# Patient Record
Sex: Female | Born: 1945
Health system: Southern US, Community
[De-identification: ages and names within clinical notes are randomized; demographics above are authoritative.]

## PROBLEM LIST (undated history)

## (undated) ENCOUNTER — Emergency Department (HOSPITAL_COMMUNITY): Admission: EM | Payer: Medicare Other | Source: Home / Self Care

## (undated) DIAGNOSIS — J449 Chronic obstructive pulmonary disease, unspecified: Secondary | ICD-10-CM

## (undated) DIAGNOSIS — C801 Malignant (primary) neoplasm, unspecified: Secondary | ICD-10-CM

---

## 2000-02-26 ENCOUNTER — Other Ambulatory Visit: Admission: RE | Admit: 2000-02-26 | Discharge: 2000-02-26 | Payer: Self-pay | Admitting: Gynecology

## 2004-02-16 ENCOUNTER — Encounter (INDEPENDENT_AMBULATORY_CARE_PROVIDER_SITE_OTHER): Payer: Self-pay | Admitting: Specialist

## 2004-02-16 ENCOUNTER — Ambulatory Visit (HOSPITAL_COMMUNITY): Admission: RE | Admit: 2004-02-16 | Discharge: 2004-02-17 | Payer: Self-pay | Admitting: Otolaryngology

## 2007-06-17 ENCOUNTER — Other Ambulatory Visit: Admission: RE | Admit: 2007-06-17 | Discharge: 2007-06-17 | Payer: Self-pay | Admitting: Family Medicine

## 2007-06-26 ENCOUNTER — Encounter: Admission: RE | Admit: 2007-06-26 | Discharge: 2007-06-26 | Payer: Self-pay | Admitting: Family Medicine

## 2007-07-07 ENCOUNTER — Encounter: Admission: RE | Admit: 2007-07-07 | Discharge: 2007-07-07 | Payer: Self-pay | Admitting: Family Medicine

## 2008-10-03 ENCOUNTER — Inpatient Hospital Stay (HOSPITAL_COMMUNITY): Admission: EM | Admit: 2008-10-03 | Discharge: 2008-10-07 | Payer: Self-pay | Admitting: Emergency Medicine

## 2009-05-05 ENCOUNTER — Emergency Department (HOSPITAL_COMMUNITY): Admission: EM | Admit: 2009-05-05 | Discharge: 2009-05-05 | Payer: Self-pay | Admitting: Emergency Medicine

## 2009-05-08 ENCOUNTER — Emergency Department (HOSPITAL_COMMUNITY): Admission: EM | Admit: 2009-05-08 | Discharge: 2009-05-08 | Payer: Self-pay | Admitting: Emergency Medicine

## 2011-01-07 LAB — C-REACTIVE PROTEIN: CRP: 11.1 mg/dL — ABNORMAL HIGH (ref <0.6)

## 2011-01-07 LAB — COMPREHENSIVE METABOLIC PANEL
ALT: 10 U/L (ref 0–35)
ALT: 13 U/L (ref 0–35)
AST: 17 U/L (ref 0–37)
AST: 21 U/L (ref 0–37)
Albumin: 2.3 g/dL — ABNORMAL LOW (ref 3.5–5.2)
Albumin: 2.5 g/dL — ABNORMAL LOW (ref 3.5–5.2)
Albumin: 2.5 g/dL — ABNORMAL LOW (ref 3.5–5.2)
Alkaline Phosphatase: 56 U/L (ref 39–117)
BUN: 2 mg/dL — ABNORMAL LOW (ref 6–23)
BUN: 4 mg/dL — ABNORMAL LOW (ref 6–23)
CO2: 27 mEq/L (ref 19–32)
CO2: 28 mEq/L (ref 19–32)
Chloride: 102 mEq/L (ref 96–112)
Chloride: 107 mEq/L (ref 96–112)
Chloride: 111 mEq/L (ref 96–112)
Creatinine, Ser: 0.65 mg/dL (ref 0.4–1.2)
GFR calc Af Amer: >60 mL/min (ref >60)
GFR calc non Af Amer: >60 mL/min (ref >60)
GFR calc non Af Amer: >60 mL/min (ref >60)
Potassium: 3.3 mEq/L — ABNORMAL LOW (ref 3.5–5.1)
Potassium: 3.4 mEq/L — ABNORMAL LOW (ref 3.5–5.1)
Potassium: 3.4 mEq/L — ABNORMAL LOW (ref 3.5–5.1)
Sodium: 139 mEq/L (ref 135–145)
Sodium: 141 mEq/L (ref 135–145)
Total Bilirubin: 0.4 mg/dL (ref 0.3–1.2)
Total Bilirubin: 0.6 mg/dL (ref 0.3–1.2)
Total Protein: 5.5 g/dL — ABNORMAL LOW (ref 6.0–8.3)
Total Protein: 5.9 g/dL — ABNORMAL LOW (ref 6.0–8.3)

## 2011-01-07 LAB — CBC
HCT: 34.5 % — ABNORMAL LOW (ref 36.0–46.0)
HCT: 46.1 % — ABNORMAL HIGH (ref 36.0–46.0)
Hemoglobin: 11.4 g/dL — ABNORMAL LOW (ref 12.0–15.0)
Hemoglobin: 15.2 g/dL — ABNORMAL HIGH (ref 12.0–15.0)
MCHC: 32.9 g/dL (ref 30.0–36.0)
MCV: 93 fL (ref 78.0–100.0)
MCV: 93.2 fL (ref 78.0–100.0)
Platelets: 174 10*3/uL (ref 150–400)
Platelets: 205 10*3/uL (ref 150–400)
Platelets: 214 10*3/uL (ref 150–400)
Platelets: 243 K/uL (ref 150–400)
RBC: 3.73 MIL/uL — ABNORMAL LOW (ref 3.87–5.11)
RBC: 3.79 MIL/uL — ABNORMAL LOW (ref 3.87–5.11)
RBC: 4.95 MIL/uL (ref 3.87–5.11)
RDW: 14.4 % (ref 11.5–15.5)
RDW: 14.4 % (ref 11.5–15.5)
WBC: 12.9 K/uL — ABNORMAL HIGH (ref 4.0–10.5)
WBC: 4.9 10*3/uL (ref 4.0–10.5)
WBC: 5.3 10*3/uL (ref 4.0–10.5)

## 2011-01-07 LAB — CULTURE, BLOOD (ROUTINE X 2): Culture: NO GROWTH

## 2011-01-07 LAB — DIFFERENTIAL
Basophils Absolute: 0 10*3/uL (ref 0.0–0.1)
Basophils Relative: 0 % (ref 0–1)
Eosinophils Absolute: 0 K/uL (ref 0.0–0.7)
Eosinophils Relative: 0 % (ref 0–5)
Lymphocytes Relative: 7 % — ABNORMAL LOW (ref 12–46)
Lymphs Abs: 0.9 K/uL (ref 0.7–4.0)
Monocytes Absolute: 0.7 10*3/uL (ref 0.1–1.0)
Monocytes Relative: 5 % (ref 3–12)
Neutro Abs: 11.2 10*3/uL — ABNORMAL HIGH (ref 1.7–7.7)
Neutrophils Relative %: 87 % — ABNORMAL HIGH (ref 43–77)

## 2011-01-07 LAB — BASIC METABOLIC PANEL
BUN: 14 mg/dL (ref 6–23)
Calcium: 9.2 mg/dL (ref 8.4–10.5)
Creatinine, Ser: 0.87 mg/dL (ref 0.4–1.2)
GFR calc Af Amer: >60 mL/min (ref >60)

## 2011-01-07 LAB — BASIC METABOLIC PANEL WITH GFR
CO2: 27 meq/L (ref 19–32)
Chloride: 97 meq/L (ref 96–112)
GFR calc non Af Amer: >60 mL/min (ref >60)
Glucose, Bld: 98 mg/dL (ref 70–99)
Potassium: 3.8 meq/L (ref 3.5–5.1)
Sodium: 134 meq/L — ABNORMAL LOW (ref 135–145)

## 2011-01-27 ENCOUNTER — Emergency Department (HOSPITAL_COMMUNITY)
Admission: EM | Admit: 2011-01-27 | Discharge: 2011-01-27 | Disposition: A | Discharge status: Home / Self Care | Payer: Self-pay | Attending: Emergency Medicine | Admitting: Emergency Medicine

## 2011-01-27 DIAGNOSIS — H109 Unspecified conjunctivitis: Secondary | ICD-10-CM | POA: Insufficient documentation

## 2011-01-27 DIAGNOSIS — H9209 Otalgia, unspecified ear: Secondary | ICD-10-CM | POA: Insufficient documentation

## 2011-01-27 DIAGNOSIS — J449 Chronic obstructive pulmonary disease, unspecified: Secondary | ICD-10-CM | POA: Insufficient documentation

## 2011-01-27 DIAGNOSIS — I2510 Atherosclerotic heart disease of native coronary artery without angina pectoris: Secondary | ICD-10-CM | POA: Insufficient documentation

## 2011-01-27 DIAGNOSIS — H9210 Otorrhea, unspecified ear: Secondary | ICD-10-CM | POA: Insufficient documentation

## 2011-01-27 DIAGNOSIS — J4489 Other specified chronic obstructive pulmonary disease: Secondary | ICD-10-CM | POA: Insufficient documentation

## 2011-01-27 DIAGNOSIS — H60399 Other infective otitis externa, unspecified ear: Secondary | ICD-10-CM | POA: Insufficient documentation

## 2011-02-05 NOTE — Discharge Summary (Signed)
Victoria Kennedy, Victoria Kennedy               ACCOUNT NO.:  [id number]   MEDICAL RECORD NO.:  [id number]           PATIENT TYPE:   LOCATION:                                 FACILITY:   PHYSICIAN:  Renee Ramus, MD       DATE OF BIRTH:  1946/04/11   DATE OF ADMISSION:  DATE OF DISCHARGE:                               DISCHARGE SUMMARY   PRIMARY DISCHARGE DIAGNOSIS:  Facial cellulitis.   SECONDARY DIAGNOSES:  1. Otitis media.  2. Tobacco history.   HOSPITAL COURSE:  1. Facial cellulitis:  The patient is a 65 year old female admitted      secondary to pain and swelling in the right side of her face and      ear.  The patient had symptoms approximately 1 week prior to      admission.  She noticed a lot of draining from her right ear.  She      was seen in the emergency department.  There, she was diagnosed      with cellulitis and erysipelas.  She was placed on broad-spectrum      antibiotics and she responded well.  She is being tailored to      ciprofloxacin and will continue taking ciprofloxacin x2 weeks      postdischarge.  This will co-treat her otitis media and so she      requires no other antibiotics.  The patient will also use Benadryl      p.r.n. for swelling and for itching.   LABORATORY DATA OF NOTE:  1. Initial leukocytosis with white count of 12.9, decreasing to 4.9.  2. Mild anemia with a hemoglobin of 11.4 and hematocrit of 34.5.  3. Mild hypoalbuminemia with an albumin of 2.5.  4. CRP elevated at 11.1.   STUDIES:  CT of the face showing swelling of the pinna and the right  face consistent with cellulitis, but no sign of drainable soft tissue  abscess.   DISCHARGE MEDICATIONS:  Ciprofloxacin 500 mg 1 p.o. b.i.d. x14 days.   There are no labs or studies pending at the time of discharge.  The  patient is in stable condition and anxious for discharge.   TIME SPENT:  35 minutes.      Renee Ramus, MD  Electronically Signed     JF/MEDQ  D:  10/07/2008  T:   10/08/2008  Job:  [id number]

## 2011-02-05 NOTE — Consult Note (Signed)
[name], [name]               ACCOUNT NO.:  [id number]   MEDICAL RECORD NO.:  [id number]          PATIENT TYPE:  INP   LOCATION:                               FACILITY:  [facility]   PHYSICIAN:  Kristine Garbe. Ezzard Standing, M.D.DATE OF BIRTH:  09-Aug-1946   DATE OF CONSULTATION:  10/04/2008  DATE OF DISCHARGE:                                 CONSULTATION   REASON FOR CONSULTATION:  Evaluate the patient with right ear  cellulitis.   BRIEF HISTORY:  Victoria Kennedy is a 65 year old female who on Sunday  rubbed the sore area behind the right ear.  The next morning, she woke  up with a diffuse swelling, erythema of the right ear and right side of  her face, was seen in [facility] Emergency Room and admitted and started on IV  antibiotics and I was consulted to evaluate right ear swelling and  cellulitis.  On examination, the patient has diffuse swelling of the  entire pinna, was very erythematous, consistent with cellulitis.  On  evaluation of the ear canal, she really has only a mild swelling of the  ear canal.  TM is intact.  No active drainage noted.  The cellulitis  extends around her ear down to the neck and anteriorly on the right  cheek.   IMPRESSION:  Right external ear cellulitis, as well as facial  cellulitis, most likely related to infection, which began behind her  ear, probably the staphylococcus but possibly could represent the  Pseudomonas.   RECOMMENDATIONS:  Placed the ear wick in the right ear canal.  We will  have to continue with the Ciprodex ear drops 4 drops in right ear twice  a day.  Recommend IV antibiotics until cellulitis starts improving and  would cover Pseudomonas as well as staph.  Recommend removing the ear  wick in 3-4 days.  We will follow up with the patient.  The patient is  discharged and can follow up in my office if needed to have ear wick  removed.           ______________________________  Kristine Garbe Ezzard Standing, M.D.     CEN/MEDQ  D:  10/04/2008  T:   10/04/2008  Job:  [id number]

## 2011-02-05 NOTE — H&P (Signed)
Victoria Kennedy, Victoria Kennedy               ACCOUNT NO.:  [id number]   MEDICAL RECORD NO.:  [id number]          PATIENT TYPE:  EMS   LOCATION:  [facility]                         FACILITY:  [facility]   PHYSICIAN:  Ladell Pier, M.D.   DATE OF BIRTH:  12/20/1945   DATE OF ADMISSION:  10/03/2008  DATE OF DISCHARGE:                              HISTORY & PHYSICAL   CHIEF COMPLAINT:  Swelling and pain in the left ear.   HISTORY OF PRESENT ILLNESS:  Patient is a 65 year old white female who  presented to the emergency room secondary to pain and swelling in the  right side of the face and the right ear.  Patient stated about a week  ago, she noted that she had decreased hearing in the right ear.  She  then noted a lot of white drainage from the ear, and then yesterday, she  noted the swelling and redness of the ear spreading towards her face.  She stated that it is tender to the touch.  She has had subjective  fevers at home but no chills.  She noted a lot of postnasal drainage as  well.   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 Haroldine Laws.  She also has history  of bronchitis.   FAMILY HISTORY:  Her mother died from emphysema in 55.  Father died  from heart failure, question of the age.   SOCIAL HISTORY:  She smokes 1-1/2 packs of cigarettes per day.  No  alcohol use.  She is widowed.  She has 1 child.  She is unemployed.   MEDICATIONS:  1. Aspirin 81 mg daily.  2. Ibuprofen as needed.   ALLERGIES:  TYLENOL WITH CODEINE.   REVIEW OF SYSTEMS:  Negative, otherwise as stated in the HPI.   PHYSICAL EXAMINATION:  VITAL SIGNS:  Temperature 99.4, blood pressure  118/71, pulse 113, respirations 18, pulse oximetry 92% on room air.  GENERAL:  Patient is sitting up on the chair, does not seem to be in any  acute distress.  HEENT:  Head is normocephalic, atraumatic.  Pupils are reactive to  light.  Throat is without erythema.  She  just has erythema covering her  right ear with swelling almost to the mid-face.  On the right side of  the face is where she has the cellulitis, the drainage and swelling of  the ear.  On the left side, she also has otitis media.  She has some  white drainage from the left ear as well.  LUNGS:  Clear bilaterally.  ABDOMEN:  Positive bowel sounds.  EXTREMITIES:  Without edema.   LABORATORIES:  WBC 12.9, hemoglobin 15.2, MCV 93.2, platelet of 243,  sodium 134, potassium 3.8, chloride 97, CO2 of 27, glucose 98, BUN 14,  creatinine 0.87.   CT scan of the head does not show any abscess.   ASSESSMENT/PLAN:  We will admit patient to the hospital.  We will put  her on Ciprodex ear drops.  We will put her on IV Cipro to cover  Pseudomonas and  will put her on clindamycin and vancomycin for the  facial cellulitis to cover for possibly methicillin-resistant  Staphylococcus aureus.  We also consulted Dr. Ezzard Standing, ENT, who will see  her in the morning.  We will get blood cultures x2.  We will put her on  a nicotine patch for smoking.  We will use Lovenox for deep venous  thrombosis prophylaxis and Protonix for gastrointestinal prophylaxis.  We will continue her on an aspirin a day.  Intravenous fluids normal  saline at 75 mL an hour.      Ladell Pier, M.D.  Electronically Signed     NJ/MEDQ  D:  10/03/2008  T:  10/03/2008  Job:  [id number]

## 2011-02-08 NOTE — H&P (Signed)
NAME:  Victoria Kennedy, Victoria Kennedy                         ACCOUNT NO.:  [id number]   MEDICAL RECORD NO.:  [id number]                   PATIENT TYPE:  OIB   LOCATION:  NA                                   FACILITY:  [facility]   PHYSICIAN:  Hermelinda Medicus, M.D.                DATE OF BIRTH:  Nov 27, 1945   DATE OF ADMISSION:  DATE OF DISCHARGE:                                HISTORY & PHYSICAL   HISTORY OF PRESENT ILLNESS:  This patient is a 65 year old female who has  had a sore spot in the right floor of her mouth.  She has been on  antibiotics.  She also has full dentures and first came in to see me when  she had had some sinusitis and bronchitis.  The larynx was very  erythematous.  She was placed on Cefzil because she also had the irritation  on the floor of her mouth on the right side.  She wears dentures and we felt  that this may be part of the problem.  However, this did not improve. She,  therefore, was biopsied and was found to be a squamous cell invasive in situ  and now she enters with a plan of a resection of the floor of her mouth  wherein we will reestablish her submaxillary duct.  She is aware of the  risks and gains. She was aware that she may have to have her dentures  refitted and that she will not be able to wear these for some time and that  she is going to have to be on a very soft diet.   ALLERGIES:  DARVOCET.   PAST MEDICAL HISTORY:  1. History of bronchitis.  2. She is a smoker of up to two packs a day but has cut back, considerably     less than one.  3. She has also had a history of pneumonia.  4. History of reflux esophagitis, takes Prilosec.  5. She has been on Ketek recently and cephalosporin for her upper     respiratory tract infections.   PAST SURGICAL HISTORY:  She has had previous surgeries on her foot.   PHYSICAL EXAMINATION:  VITAL SIGNS:  Her blood pressure is 134/74 with a  pulse of 62.  HEENT:  Her ears are clear.  Tympanic membranes are clear.  Oral  cavity  shows the lesion on the floor of her mouth.  She does not have any  tenderness in the submaxillary region and this appears to be quite  superficial.  She does not have any neck nodes.  Her larynx is clear.  She  does have still some evidence of reflux esophagitis and is on Prilosec 30  mg.  Her laryngeal examination reveals the mild erythema of the true cords,  false cords, epiglottis, base of tongue which show no evidence of any  malignancy.  True cord mobility, gag reflex, tongue mobility, EOMs, facial  nerve are all symmetrical.  CHEST:  Increased AP diameter, decreased breath sounds with some cough.  When treated with an inhaler this morning, mild cough with bronchitic  sounds.  CARDIOVASCULAR:  Her EKG is within normal limits.  Chest x-ray is within  normal limits.  Also  no evidence of murmurs or gallops.  ABDOMEN:  Unremarkable.  EXTREMITIES:  Unremarkable except above-mentioned surgeries.   INITIAL DIAGNOSES:  1. Excision of invasive squamous cell carcinoma, right floor of mouth.  2. History of bronchitis with pneumonia.  3. History of reflux esophagitis.                                                Hermelinda Medicus, M.D.    JC/MEDQ  D:  02/16/2004  T:  02/17/2004  Job:  [id number]   cc:   Victoria Kennedy

## 2011-02-08 NOTE — Op Note (Signed)
Victoria Kennedy, Victoria Kennedy                         ACCOUNT NO.:  [id number]   MEDICAL RECORD NO.:  [id number]                   PATIENT TYPE:  OIB   LOCATION:  NA                                   FACILITY:  [facility]   PHYSICIAN:  Hermelinda Medicus, M.D.                DATE OF BIRTH:  1946/05/20   DATE OF PROCEDURE:  02/16/2004  DATE OF DISCHARGE:                                 OPERATIVE REPORT   PREOPERATIVE DIAGNOSIS:  Squamous cell carcinoma biopsied, right floor of  mouth including gingival buccal gutter.   POSTOPERATIVE DIAGNOSIS:  Squamous cell carcinoma biopsied, right floor of  mouth including gingival buccal gutter.   OPERATION:  Excision of squamous cell carcinoma right floor of mouth with  frozen section, margins found to be clear.   SURGEON:  Hermelinda Medicus, M.D.   ANESTHESIA:  General endotracheal with Dr. Jean Rosenthal   PROCEDURE:  The patient was placed in the supine position.  Under general  endotracheal anesthesia, the oral cavity was prepared with Betadine and a  small retractor was used.  The margins of this lesion were carefully  delineated.  We felt we knew exactly where the submaxillary duct was and  were planning to avoid this, though it was very difficult to cannulate.  Once the margins were delineated, then the lesion was excised carefully  using the coagulation and cutting electrocautery where we extended down  along the floor of the oral cavity over near the submaxillary duct avoiding  this, and then working out anteriorly across the bony mandible but not  getting into the gingival buccal gutter, using that as a margin.  We  extended back posteriorly to the posterior extent of this lesion along the  floor of the mouth taking this intact.  We also extended our dissection deep  into the submaxillary gland and took a specific or separate node that  appeared to be a different nature than the submaxillary gland but was in the  submaxillary gland.  Once the entire lesion  was removed, it was marked at 6  o'clock, and then margins were checked at 3, 6, 9, and 12, these were called  back by Dr. Delila Spence as clear.  The node will be used for limited section.  We  did rebiopsy the lesion which was found to be squamous cell carcinoma.  Once  this was cleared, closure was with 2-0 chromic catgut interrupted buried  sutures and the blood loss was estimated at 20 mL.  The patient tolerated  the procedure very well and is doing well postoperatively.  He will be kept  as an outpatient and, unless we have any further pulmonary problems, the  patient has had bronchitis and has had pneumonia in the past.  The patient  is doing well postop.  Follow up will be in five days, three weeks, six  weeks, three months, six months, and a year.  Hermelinda Medicus, M.D.    JC/MEDQ  D:  02/16/2004  T:  02/16/2004  Job:  [id number]

## 2011-10-22 ENCOUNTER — Encounter (HOSPITAL_COMMUNITY): Payer: Self-pay

## 2011-10-22 ENCOUNTER — Emergency Department (HOSPITAL_COMMUNITY): Payer: Medicare Other

## 2011-10-22 ENCOUNTER — Encounter (HOSPITAL_COMMUNITY)
Admission: EM | Disposition: A | Discharge status: Home / Self Care | Payer: Self-pay | Source: Home / Self Care | Attending: Orthopedic Surgery

## 2011-10-22 ENCOUNTER — Inpatient Hospital Stay (HOSPITAL_COMMUNITY)
Admission: EM | Admit: 2011-10-22 | Discharge: 2011-10-23 | DRG: 906 | Disposition: A | Discharge status: Home / Self Care | Payer: Medicare Other | Attending: Orthopedic Surgery | Admitting: Orthopedic Surgery

## 2011-10-22 DIAGNOSIS — S68019A Complete traumatic metacarpophalangeal amputation of unspecified thumb, initial encounter: Principal | ICD-10-CM | POA: Diagnosis present

## 2011-10-22 DIAGNOSIS — F17200 Nicotine dependence, unspecified, uncomplicated: Secondary | ICD-10-CM | POA: Diagnosis present

## 2011-10-22 DIAGNOSIS — W540XXA Bitten by dog, initial encounter: Secondary | ICD-10-CM | POA: Diagnosis present

## 2011-10-22 DIAGNOSIS — J449 Chronic obstructive pulmonary disease, unspecified: Secondary | ICD-10-CM | POA: Diagnosis present

## 2011-10-22 DIAGNOSIS — S68522A Partial traumatic transphalangeal amputation of left thumb, initial encounter: Secondary | ICD-10-CM

## 2011-10-22 DIAGNOSIS — J4489 Other specified chronic obstructive pulmonary disease: Secondary | ICD-10-CM | POA: Diagnosis present

## 2011-10-22 HISTORY — DX: Chronic obstructive pulmonary disease, unspecified: J44.9

## 2011-10-22 SURGERY — IRRIGATION AND DEBRIDEMENT EXTREMITY
Anesthesia: General | Laterality: Left

## 2011-10-22 MED ORDER — SODIUM CHLORIDE 0.9 % IV SOLN
3.0000 g | Freq: Four times a day (QID) | INTRAVENOUS | Status: AC
  Administered 2011-10-22 – 2011-10-23 (×3): 3 g via INTRAVENOUS
  Filled 2011-10-22 (×4): qty 3

## 2011-10-22 MED ORDER — ALBUTEROL SULFATE HFA 108 (90 BASE) MCG/ACT IN AERS: 2.0000 | INHALATION_SPRAY | Freq: Four times a day (QID) | RESPIRATORY_TRACT | Status: DC | PRN

## 2011-10-22 MED ORDER — ONDANSETRON HCL 4 MG PO TABS: 4.0000 mg | ORAL_TABLET | Freq: Four times a day (QID) | ORAL | Status: DC | PRN

## 2011-10-22 MED ORDER — MORPHINE SULFATE 4 MG/ML IJ SOLN
4.0000 mg | Freq: Once | INTRAMUSCULAR | Status: AC
  Administered 2011-10-22: 4 mg via INTRAVENOUS
  Filled 2011-10-22: qty 1

## 2011-10-22 MED ORDER — SENNA 8.6 MG PO TABS
1.0000 | ORAL_TABLET | Freq: Two times a day (BID) | ORAL | Status: DC
  Administered 2011-10-22 – 2011-10-23 (×2): 8.6 mg via ORAL
  Filled 2011-10-22 (×3): qty 1

## 2011-10-22 MED ORDER — ONDANSETRON HCL 4 MG/2ML IJ SOLN
4.0000 mg | Freq: Four times a day (QID) | INTRAMUSCULAR | Status: DC | PRN
  Administered 2011-10-22: 4 mg via INTRAVENOUS
  Filled 2011-10-22: qty 2

## 2011-10-22 MED ORDER — ONDANSETRON HCL 4 MG/2ML IJ SOLN
4.0000 mg | Freq: Once | INTRAMUSCULAR | Status: AC
  Administered 2011-10-22: 4 mg via INTRAVENOUS
  Filled 2011-10-22: qty 2

## 2011-10-22 MED ORDER — TETANUS-DIPHTH-ACELL PERTUSSIS 5-2.5-18.5 LF-MCG/0.5 IM SUSP
0.5000 mL | Freq: Once | INTRAMUSCULAR | Status: AC
  Administered 2011-10-22: 0.5 mL via INTRAMUSCULAR
  Filled 2011-10-22: qty 0.5

## 2011-10-22 MED ORDER — ALPRAZOLAM 0.5 MG PO TABS
0.5000 mg | ORAL_TABLET | Freq: Four times a day (QID) | ORAL | Status: DC | PRN
  Administered 2011-10-22: 0.5 mg via ORAL
  Filled 2011-10-22: qty 1

## 2011-10-22 MED ORDER — AMPICILLIN-SULBACTAM SODIUM 3 (2-1) G IJ SOLR
3.0000 g | Freq: Once | INTRAMUSCULAR | Status: AC
  Administered 2011-10-22: 3 g via INTRAVENOUS
  Filled 2011-10-22: qty 3

## 2011-10-22 MED ORDER — SODIUM CHLORIDE 0.45 % IV SOLN: INTRAVENOUS | Status: DC

## 2011-10-22 MED ORDER — CIPROFLOXACIN-HYDROCORTISONE 0.2-1 % OT SUSP
3.0000 [drp] | Freq: Two times a day (BID) | OTIC | Status: DC
  Administered 2011-10-22 – 2011-10-23 (×2): 3 [drp] via OTIC
  Filled 2011-10-22: qty 10

## 2011-10-22 MED ORDER — HYDROCODONE-ACETAMINOPHEN 5-325 MG PO TABS
1.0000 | ORAL_TABLET | ORAL | Status: DC | PRN
  Administered 2011-10-23: 2 via ORAL
  Filled 2011-10-22: qty 2

## 2011-10-22 MED ORDER — MORPHINE SULFATE 2 MG/ML IJ SOLN
1.0000 mg | INTRAMUSCULAR | Status: DC | PRN
  Administered 2011-10-22 – 2011-10-23 (×2): 1 mg via INTRAVENOUS
  Filled 2011-10-22 (×2): qty 1

## 2011-10-22 MED ORDER — ADULT MULTIVITAMIN W/MINERALS CH
1.0000 | ORAL_TABLET | Freq: Every day | ORAL | Status: DC
  Administered 2011-10-22 – 2011-10-23 (×2): 1 via ORAL
  Filled 2011-10-22 (×2): qty 1

## 2011-10-22 MED ORDER — FAMOTIDINE 20 MG PO TABS
20.0000 mg | ORAL_TABLET | Freq: Two times a day (BID) | ORAL | Status: DC | PRN
  Filled 2011-10-22: qty 1

## 2011-10-22 SURGICAL SUPPLY — 39 items
BANDAGE CONFORM 2  STR LF (GAUZE/BANDAGES/DRESSINGS) IMPLANT
BANDAGE ELASTIC 4 VELCRO ST LF (GAUZE/BANDAGES/DRESSINGS) ×2 IMPLANT
BANDAGE GAUZE ELAST BULKY 4 IN (GAUZE/BANDAGES/DRESSINGS) ×2 IMPLANT
CLOTH BEACON ORANGE TIMEOUT ST (SAFETY) ×2 IMPLANT
CORDS BIPOLAR (ELECTRODE) ×2 IMPLANT
CUFF TOURNIQUET SINGLE 18IN (TOURNIQUET CUFF) ×2 IMPLANT
CUFF TOURNIQUET SINGLE 24IN (TOURNIQUET CUFF) IMPLANT
CUFF TOURNIQUET SINGLE 34IN LL (TOURNIQUET CUFF) IMPLANT
CUFF TOURNIQUET SINGLE 44IN (TOURNIQUET CUFF) IMPLANT
DRSG ADAPTIC 3X8 NADH LF (GAUZE/BANDAGES/DRESSINGS) ×2 IMPLANT
ELECT REM PT RETURN 9FT ADLT (ELECTROSURGICAL)
ELECTRODE REM PT RTRN 9FT ADLT (ELECTROSURGICAL) IMPLANT
GAUZE XEROFORM 1X8 LF (GAUZE/BANDAGES/DRESSINGS) ×2 IMPLANT
GLOVE BIOGEL M STRL SZ7.5 (GLOVE) ×2 IMPLANT
GLOVE SS BIOGEL STRL SZ 8 (GLOVE) ×1 IMPLANT
GLOVE SUPERSENSE BIOGEL SZ 8 (GLOVE) ×1
GOWN PREVENTION PLUS XLARGE (GOWN DISPOSABLE) ×2 IMPLANT
GOWN STRL NON-REIN LRG LVL3 (GOWN DISPOSABLE) ×6 IMPLANT
HANDPIECE INTERPULSE COAX TIP (DISPOSABLE)
KIT BASIN OR (CUSTOM PROCEDURE TRAY) ×2 IMPLANT
KIT ROOM TURNOVER OR (KITS) ×2 IMPLANT
MANIFOLD NEPTUNE II (INSTRUMENTS) ×2 IMPLANT
NEEDLE HYPO 25GX1X1/2 BEV (NEEDLE) IMPLANT
NS IRRIG 1000ML POUR BTL (IV SOLUTION) ×2 IMPLANT
PACK ORTHO EXTREMITY (CUSTOM PROCEDURE TRAY) ×2 IMPLANT
PAD ARMBOARD 7.5X6 YLW CONV (MISCELLANEOUS) ×4 IMPLANT
PAD CAST 4YDX4 CTTN HI CHSV (CAST SUPPLIES) ×1 IMPLANT
PADDING CAST COTTON 4X4 STRL (CAST SUPPLIES) ×1
SET HNDPC FAN SPRY TIP SCT (DISPOSABLE) IMPLANT
SPONGE GAUZE 4X4 12PLY (GAUZE/BANDAGES/DRESSINGS) ×2 IMPLANT
SPONGE LAP 18X18 X RAY DECT (DISPOSABLE) ×2 IMPLANT
SPONGE LAP 4X18 X RAY DECT (DISPOSABLE) ×2 IMPLANT
SYR CONTROL 10ML LL (SYRINGE) IMPLANT
TOWEL OR 17X24 6PK STRL BLUE (TOWEL DISPOSABLE) ×2 IMPLANT
TOWEL OR 17X26 10 PK STRL BLUE (TOWEL DISPOSABLE) ×2 IMPLANT
TUBE ANAEROBIC SPECIMEN COL (MISCELLANEOUS) IMPLANT
TUBE CONNECTING 12X1/4 (SUCTIONS) ×2 IMPLANT
WATER STERILE IRR 1000ML POUR (IV SOLUTION) ×2 IMPLANT
YANKAUER SUCT BULB TIP NO VENT (SUCTIONS) ×2 IMPLANT

## 2011-10-22 NOTE — ED Notes (Signed)
Pt presents to ED w/partial amputation to distal aspect of Left thumb, pt attempted to break up a dog fight b/w her 2 dogs, bleeding controlled at this time, pt unable to find the tip of her thumb, palpable pulses, pt is able to move extremity, pt reports her pain as a numbing pain, rates the pain 10/10.

## 2011-10-22 NOTE — ED Notes (Addendum)
Moving patient to CDU 11 for procedure then will return to [facility] per Dr. Flonnie Overman.

## 2011-10-22 NOTE — ED Notes (Signed)
Dr Gramig at bedside. 

## 2011-10-22 NOTE — ED Provider Notes (Signed)
History     CSN: [id number]  Arrival date & time 10/22/11  1140   First MD Initiated Contact with Patient 10/22/11 1159      Chief Complaint  Patient presents with  . Animal Bite    (Consider location/radiation/quality/duration/timing/severity/associated sxs/prior treatment) Patient is a 66 y.o. female presenting with animal bite.  Animal Bite  The incident occurred just prior to arrival. The incident occurred at home. She came to the ER via personal transport. There is an injury to the left thumb. The pain is moderate. It is unlikely that a foreign body is present. Associated symptoms include nausea. Pertinent negatives include no chest pain, no abdominal pain, no vomiting, no neck pain, no pain when bearing weight and no cough. She is right-handed.    Past Medical History  Diagnosis Date  . COPD (chronic obstructive pulmonary disease)     No past surgical history on file.  No family history on file.  History  Substance Use Topics  . Smoking status: Not on file  . Smokeless tobacco: Not on file  . Alcohol Use:     OB History    Grav Para Term Preterm Abortions TAB SAB Ect Mult Living                  Review of Systems  Constitutional: Negative for fever.  HENT: Negative for congestion and neck pain.   Respiratory: Negative for cough and shortness of breath.   Cardiovascular: Negative for chest pain.  Gastrointestinal: Positive for nausea. Negative for vomiting, abdominal pain and diarrhea.  Genitourinary: Negative for difficulty urinating.  All other systems reviewed and are negative.    Allergies  Review of patient's allergies indicates not on file.  Home Medications  No current outpatient prescriptions on file.  BP 134/63  Pulse 108  Temp(Src) 97.5 F (36.4 C) (Oral)  Resp 18  SpO2 93%  Physical Exam  Nursing note and vitals reviewed. Constitutional: She is oriented to person, place, and time. She appears well-developed and well-nourished. No  distress.  HENT:  Head: Normocephalic and atraumatic.  Mouth/Throat: Oropharynx is clear and moist.  Eyes: Conjunctivae are normal. Pupils are equal, round, and reactive to light. No scleral icterus.  Neck: Neck supple.  Cardiovascular: Normal rate, regular rhythm, normal heart sounds and intact distal pulses.   No murmur heard. Pulmonary/Chest: Effort normal and breath sounds normal. No stridor. No respiratory distress. She has no rales.  Abdominal: Soft. Bowel sounds are normal. She exhibits no distension. There is no tenderness.  Musculoskeletal: Normal range of motion.       Hands: Neurological: She is alert and oriented to person, place, and time.  Skin: Skin is warm and dry. No rash noted.  Psychiatric: She has a normal mood and affect. Her behavior is normal.    ED Course  Procedures (including critical care time)  Labs Reviewed - No data to display Dg Finger Thumb Left  10/22/2011  *RADIOLOGY REPORT*  Clinical Data: Tip of left thumb bit off by dog.  LEFT THUMB 2+V  Comparison: None.  Findings: There is a soft tissue defect and bony defect at the tip of the left thumb involving the distal aspect of the distal phalanx.  No additional fractures seen. No radiopaque foreign bodies.  IMPRESSION: Soft tissue and bony amputation of the tip of the left thumb.  Original Report Authenticated By: Cyndie Chime, M.D.   All radiology studies independently viewed by me.     1. Partial traumatic  transphalangeal amputation of left thumb       MDM  66 yo female with hx of COPD presenting just after a dog bite with amputation of distal tip of left thumb.  Hemostatic, no bone visible.  Dog was patient's and dog's immunizations are up to date.  Plain film pending.  Does not recall last tetanus, given booster.    Plain film shows amputation with bony involvement.  Unasyn given.  Consulted hand surgery, Dr. Amanda Pea, who is repairing in ED.  Dispo per Dr. Amanda Pea or Dr. Meredith Pel.        Warnell Forester, MD 10/22/11 1703  Warnell Forester, MD 10/22/11 1704

## 2011-10-22 NOTE — ED Notes (Signed)
Pt reports her 2 dogs got into a fight, states she attempted to break up the fight and her Left thumb was bitten off

## 2011-10-22 NOTE — Progress Notes (Signed)
Orthopedic Tech Progress Note Patient Details:  Victoria Kennedy [birth year] [id number]  Type of Splint: Finger Splint Location: left hand Splint Interventions: Ordered  Viewed order from doctor's order list  Nikki Dom 10/22/2011, 5:36 PM

## 2011-10-22 NOTE — ED Notes (Signed)
Patient states she was stopping a fight between two dogs and one of the dogs bite the patients left hand. On examination the patient has thumb on her left hand is missing the tip of the thumb.

## 2011-10-22 NOTE — ED Provider Notes (Signed)
  I performed a history and physical examination of Victoria Kennedy and discussed her management with Dr.Breuna Loveall.  I agree with the history, physical, assessment, and plan of care, with the following exceptions: None  I was present for the following procedures: None Time Spent in Critical Care of the patient: None Time spent in discussions with the patient and family: none  Reema Chick Corlis Leak, MD 10/22/11 1954

## 2011-10-22 NOTE — ED Provider Notes (Signed)
Patient with distal aspect of left thumb amputated secondary to dog bite.  Dog is self owned and iutd.   Hilario Quarry, MD 10/22/11 [phone]

## 2011-10-22 NOTE — ED Notes (Signed)
[id number] Ready 

## 2011-10-22 NOTE — ED Notes (Signed)
Report given to Selena Batten, RN on 5000. Patient being transferred to 5014.

## 2011-10-22 NOTE — H&P (Signed)
Victoria Kennedy is an 66 y.o. female.   Chief Complaint: My dog bit my hand HPI: The patient is a pleasant 66 year old female who presents to the [facility] emergency room for evaluation of her left thumb. She sustained a dog bite to the left thumb distal tip earlier today ,she is the owner of the dog and it is a Engineer, water. The patient states that the dog's vaccinations are up to date. She is initially seen and evaluated by the emergency room staff and was found to have a partial distal tip amputation to the left thumb. She has previously been given IV antibiotics in the form of Unasyn upon her arrival per our request. She currently denies any nausea vomiting fevers chills shortness of breath chest pain or significant pain to the thumb.  Past Medical History  Diagnosis Date  . COPD (chronic obstructive pulmonary disease)     Surgical history: patient has had a prior procedure secondary to the cancer  Family history: Chron's disease in her brother and cancer in her mother unspecified  Social History: Patient smokes one pack per day for several years she denies drug or alcohol use  Allergies: Tylenol with codeine causes nausea, Benadryl causes itching Allergies  Allergen Reactions  . Benadryl (Diphenhydramine Hcl) Itching  . Tylenol (Acetaminophen) Other (See Comments)    Tylenol #3: unknown reaction    Current medications: Ibuprofen 200 mg one twice daily and is over-the-counter aspirin 325 mg daily  No results found for this or any previous visit (from the past 48 hour(s)). Dg Finger Thumb Left  10/22/2011  *RADIOLOGY REPORT*  Clinical Data: Tip of left thumb bit off by dog.  LEFT THUMB 2+V  Comparison: None.  Findings: There is a soft tissue defect and bony defect at the tip of the left thumb involving the distal aspect of the distal phalanx.  No additional fractures seen. No radiopaque foreign bodies.  IMPRESSION: Soft tissue and bony amputation of the tip of the left thumb.   Original Report Authenticated By: Cyndie Chime, M.D.    Review of Systems  Constitutional: Negative.   HENT: Negative for hearing loss, ear pain, nosebleeds, congestion, sore throat, tinnitus and ear discharge.        Difficulty hearing in the right ear  Eyes: Negative for blurred vision, double vision, photophobia, pain, discharge and redness.       She uses corrective lenses  Respiratory: Positive for cough. Negative for hemoptysis, sputum production, shortness of breath, wheezing and stridor.   Gastrointestinal: Negative.   Genitourinary: Negative.   Musculoskeletal:       See history and physical exam  Skin: Negative.   Neurological: Negative.  Negative for headaches.  Endo/Heme/Allergies: Negative.   Psychiatric/Behavioral: Negative.     Blood pressure 103/61, pulse 77, temperature 97.5 F (36.4 C), temperature source Oral, resp. rate 18, SpO2 96.00%. Physical Exam  HEENT; atraumatic normocephalic  Chest: Equal bilateral expansions present she does elicit a nonproductive cough no audible wheezing or rhonchi present   Abdomen nontender  Left upper extremity: She has an obvious partial distal tip amputation to the left thumb with exposed distal phalanx noted FPL and EPL are intact she is normal sensate about the upper extremity  Assessment/Plan Status post dog bite to the left thumb with partial distal tip amputation and exposed bony phalanx.  History of COPD no current treatment to date  We had a lengthy discussion with the patient today in regards to her upper extremity predicament  as well as a recommended treatment options. At discharge we recommend aggressive irrigation and excisional debridement of the necrotic tissue as well as attempts at reconstruction of the amputation. As we can proceed with a Moberg advancement flap. Discussed with her risk and benefits of this surgery to include bleeding infection anesthesia damage normal structures and correct the problem and  restore function. Patient desires to proceed. We have discussed with her possibly admitting her for observation and IV antibiotics to prevent infection. All questions were encouraged and answered.  Samwise Eckardt L 10/22/2011, 4:25 PM

## 2011-10-22 NOTE — ED Notes (Signed)
Patient resting with family at bedside. Patient remains on 3L oxygen with saturation of 96%. NAD at this time.

## 2011-10-22 NOTE — Progress Notes (Signed)
Patient ID: Victoria Kennedy, female   DOB: 25-May-1946, 66 y.o.   MRN: [id number] Preop diagnosis: Dogbite with amputation to the left thumb  Postop diagnosis same  Procedure: Irrigation and debridement left thumb open fracture involving the skin subcutaneous tissue tendon and bone #2 nail plate removal left thumb #3 nailbed repair left thumb #4 open treatment distal phalanx fracture left thumb #5 Moberg advancement flap left thumb  Surgeon Levent Kornegay M.D. please see full dictation#[id number]

## 2011-10-22 NOTE — ED Notes (Signed)
Patient resting with NAD at this time. Bleeding remains controlled.

## 2011-10-22 NOTE — ED Notes (Signed)
Pt put on 4 L/min via nasal cannula.

## 2011-10-22 NOTE — ED Notes (Signed)
Family at bedside. 

## 2011-10-23 ENCOUNTER — Encounter (HOSPITAL_COMMUNITY): Payer: Self-pay | Admitting: Orthopedic Surgery

## 2011-10-23 MED ORDER — AMOXICILLIN-POT CLAVULANATE 875-125 MG PO TABS: 1.0000 | ORAL_TABLET | Freq: Two times a day (BID) | ORAL | Status: AC

## 2011-10-23 MED ORDER — OXYCODONE HCL 5 MG PO CAPS: 5.0000 mg | ORAL_CAPSULE | ORAL | Status: AC | PRN

## 2011-10-23 NOTE — Op Note (Signed)
NAMETAMETRA, AHART               ACCOUNT NO.:  [id number]  MEDICAL RECORD NO.:  [id number]  LOCATION:  5013                         FACILITY:  [facility]  PHYSICIAN:  Dionne Ano. Naaman Curro, M.D.DATE OF BIRTH:  [birth year]  DATE OF PROCEDURE: DATE OF DISCHARGE:                              OPERATIVE REPORT   PREOPERATIVE DIAGNOSIS:  Status post dog bite to the left thumb with amputation.  POSTOPERATIVE DIAGNOSIS:  Status post dog bite to the left thumb with amputation.  PROCEDURE: 1. Irrigation and debridement of open distal phalanx fracture, left     thumb.  This includes skin, subcutaneous tissue, bone, and     periosteal tissue. 2. Moberg advancement flap, left thumb. 3. Treatment of an open fracture, left thumb distal phalanx.  SURGEON:  Dionne Ano. Amanda Pea, M.D.  ASSISTANT:  Karie Chimera, PA-C.  COMPLICATIONS:  None.  ANESTHESIA:  Wrist block.  TOURNIQUET TIME:  Less than an hour.  INDICATIONS FOR THE PROCEDURE:  Ms. Purdie is a 66 year old female who was bit by her domesticated dog, who is up-to-date on its shots.  She was given tetanus shot and Unasyn on my request.  I evaluated her and counselled her in regards to her upper extremity predicament.  She desired to proceed with the above-mentioned operative intervention.  OPERATIVE PROCEDURE:  The patient was seen by myself and underwent a wrist block anesthetic with lidocaine only without epinephrine. Following excellent anesthesia, she was prepped and draped in an usual sterile fashion with 2 separate Betadine scrubs and paints.  Following this, I scrubbed it down again with Hibiclens x2.  It has been greater than 15 minutes scrubbing her down making sure that we had good punctate bleeding from the end of the thumb.  She tolerated this well.  There were no complicating features.  The patient tolerated this quite well. There were no complicating issues.  Once this was done, I then performed very careful and cautious  treatment of the thumb distal phalanx fracture.  With a setting technique and rongeur, I performed sculpting and removal of the remaining nail plate.  Following this, I then made an incision midlateral in nature.  Skin hooks were used and I then elevated the volar flap off of the flexor tendon sheath.  I did this very meticulously in an effort to provide a Moberg advancement flap.  Once this was done to adequate length, I was then able to flex the finger and perform a complex suturing technique with 4-0 and 5-0 chromic to the distal thumb region.  I was able to perform a combination advancement flap and nail bed repair without difficulty to the thumb and there were no complications.  I deflated the tourniquet.  The flap pinked up nicely as did the dorsal tissue and I then inset the lateral folds.  Thus the patient underwent irrigation and debridement of the open fracture and treatment of the open fracture with setting technique, nail bed repair, nail plate removal, and Moberg advancement flap about the left thumb.  She tolerated this well.  There were no complicating features.  Following this, we discussed with her admission to the hospital for IV antibiotics and pain control.  She tolerated  the procedure well and there were no complicating features.  All sponge, needle, and instrument counts were reported as correct.  We will monitor her closely in the postoperative area and asked her to notify should any problems occur.  These notes have been discussed.  All questions have been encouraged and answered.  I will see her back once she is discharged for suture removal in 7-10 days and we will keep a very close eye on her extremity of course.     Dionne Ano. Amanda Pea, M.D.     [facility]  D:  10/22/2011  T:  10/23/2011  Job:  [id number]

## 2011-10-23 NOTE — Discharge Summary (Signed)
Physician Discharge Summary  Patient ID: Victoria Kennedy MRN: [id number] DOB/AGE: 12/15/1945 66 y.o.  Admit date: 10/22/2011 Discharge date: 10/23/2011  Admission Diagnoses: Dog bite with amputation to the left thumb status post reconstruction  Discharge Diagnoses: Same Active Problems:  * No active hospital problems. *    Discharged Condition: good  Hospital Course: Patient was admitted for overnight observatory care IV antibiotics and postoperative observation pain control after irrigation debridement and reconstruction of her thumb. She did very well at postop day 1 she was awake alert and oriented there is no signs of ascending infection or problems. She'll be discharged home with rocker followup including return to my clinic in 3 days.  Consults: None   Treatments: surgery: I&D and reconstruction left thumb 10/22/2011   Discharge Exam: Blood pressure 115/61, pulse 91, temperature 97.6 F (36.4 C), temperature source Oral, resp. rate 18, weight 58.968 kg (130 lb), SpO2 95.00%. Head: Normocephalic, without obvious abnormality, atraumatic Eyes: negative, conjunctivae/corneas clear. PERRL, EOM's intact. Fundi benign. Incision/Wound: patient looks excellent in terms of her thumb today there is no ascending saline rest of complicating features at this juncture.Marland KitchenMarland KitchenThe patient is alert and oriented in no acute distress the patient complains of pain in the affected upper extremity. The patient is noted to have a normal HEENT exam. Lung fields show equal chest expansion and no shortness of breath abdomen exam is nontender without distention. Lower extremity examination does not show any fracture dislocation or blood clot symptoms. Pelvis is stable neck and back are stable and nontender Disposition: Home or Self Care   Medication List  As of 10/23/2011  5:27 PM   ASK your doctor about these medications         albuterol 108 (90 BASE) MCG/ACT inhaler   Commonly known as: PROVENTIL  HFA;VENTOLIN HFA   Inhale 2 puffs into the lungs every 6 (six) hours as needed. For shortness of breath      aspirin EC 81 MG tablet   Take 81 mg by mouth daily.      ciprofloxacin-hydrocortisone otic suspension   Commonly known as: CIPRO HC OTIC   Place 3 drops into both ears 2 (two) times daily.      ibuprofen 200 MG tablet   Commonly known as: ADVIL,MOTRIN   Take 400 mg by mouth daily.             Signed: Karen Chafe 10/23/2011, 5:27 PM

## 2012-10-30 ENCOUNTER — Inpatient Hospital Stay (HOSPITAL_COMMUNITY)
Admission: EM | Admit: 2012-10-30 | Discharge: 2012-11-06 | DRG: 281 | Disposition: A | Discharge status: Home Health | Payer: Medicare Other | Attending: Cardiology | Admitting: Cardiology

## 2012-10-30 ENCOUNTER — Encounter (HOSPITAL_COMMUNITY): Payer: Self-pay | Admitting: Neurology

## 2012-10-30 ENCOUNTER — Inpatient Hospital Stay (HOSPITAL_COMMUNITY): Payer: Medicare Other

## 2012-10-30 ENCOUNTER — Emergency Department (HOSPITAL_COMMUNITY): Payer: Medicare Other

## 2012-10-30 ENCOUNTER — Encounter (HOSPITAL_COMMUNITY)
Admission: EM | Disposition: A | Discharge status: Home Health | Payer: Self-pay | Source: Home / Self Care | Attending: Cardiology

## 2012-10-30 DIAGNOSIS — R5381 Other malaise: Secondary | ICD-10-CM | POA: Diagnosis present

## 2012-10-30 DIAGNOSIS — M899 Disorder of bone, unspecified: Secondary | ICD-10-CM | POA: Diagnosis present

## 2012-10-30 DIAGNOSIS — I214 Non-ST elevation (NSTEMI) myocardial infarction: Principal | ICD-10-CM | POA: Diagnosis present

## 2012-10-30 DIAGNOSIS — M949 Disorder of cartilage, unspecified: Secondary | ICD-10-CM | POA: Diagnosis present

## 2012-10-30 DIAGNOSIS — J479 Bronchiectasis, uncomplicated: Secondary | ICD-10-CM | POA: Diagnosis present

## 2012-10-30 DIAGNOSIS — J449 Chronic obstructive pulmonary disease, unspecified: Secondary | ICD-10-CM

## 2012-10-30 DIAGNOSIS — F17200 Nicotine dependence, unspecified, uncomplicated: Secondary | ICD-10-CM | POA: Diagnosis present

## 2012-10-30 DIAGNOSIS — I2510 Atherosclerotic heart disease of native coronary artery without angina pectoris: Secondary | ICD-10-CM | POA: Diagnosis present

## 2012-10-30 DIAGNOSIS — E46 Unspecified protein-calorie malnutrition: Secondary | ICD-10-CM | POA: Diagnosis present

## 2012-10-30 DIAGNOSIS — M1990 Unspecified osteoarthritis, unspecified site: Secondary | ICD-10-CM | POA: Diagnosis present

## 2012-10-30 HISTORY — PX: LEFT HEART CATHETERIZATION WITH CORONARY ANGIOGRAM: SHX5451

## 2012-10-30 HISTORY — DX: Malignant (primary) neoplasm, unspecified: C80.1

## 2012-10-30 LAB — CBC WITH DIFFERENTIAL/PLATELET
Basophils Absolute: 0.1 10*3/uL (ref 0.0–0.1)
Basophils Absolute: 0.1 10*3/uL (ref 0.0–0.1)
Eosinophils Absolute: 0 10*3/uL (ref 0.0–0.7)
Eosinophils Relative: 0 % (ref 0–5)
Eosinophils Relative: 0 % (ref 0–5)
HCT: 46.3 % — ABNORMAL HIGH (ref 36.0–46.0)
HCT: 42.1 % (ref 36.0–46.0)
Hemoglobin: 15.7 g/dL — ABNORMAL HIGH (ref 12.0–15.0)
Lymphocytes Relative: 10 % — ABNORMAL LOW (ref 12–46)
Lymphocytes Relative: 16 % (ref 12–46)
Lymphs Abs: 0.8 10*3/uL (ref 0.7–4.0)
Lymphs Abs: 1 10*3/uL (ref 0.7–4.0)
MCH: 31.4 pg (ref 26.0–34.0)
MCV: 93.9 fL (ref 78.0–100.0)
MCV: 95 fL (ref 78.0–100.0)
Monocytes Absolute: 1.6 10*3/uL — ABNORMAL HIGH (ref 0.1–1.0)
Monocytes Absolute: 1 10*3/uL (ref 0.1–1.0)
Monocytes Relative: 20 % — ABNORMAL HIGH (ref 3–12)
Neutro Abs: 5.5 10*3/uL (ref 1.7–7.7)
Platelets: 185 10*3/uL (ref 150–400)
RDW: 13.1 % (ref 11.5–15.5)
RDW: 13.3 % (ref 11.5–15.5)
WBC: 8 10*3/uL (ref 4.0–10.5)
WBC: 6.2 10*3/uL (ref 4.0–10.5)

## 2012-10-30 LAB — COMPREHENSIVE METABOLIC PANEL
BUN: 21 mg/dL (ref 6–23)
CO2: 30 mEq/L (ref 19–32)
Calcium: 8.7 mg/dL (ref 8.4–10.5)
Chloride: 95 mEq/L — ABNORMAL LOW (ref 96–112)
Creatinine, Ser: 0.81 mg/dL (ref 0.50–1.10)
GFR calc non Af Amer: 74 mL/min — ABNORMAL LOW (ref >90)
Total Bilirubin: 0.3 mg/dL (ref 0.3–1.2)

## 2012-10-30 LAB — BASIC METABOLIC PANEL
BUN: 25 mg/dL — ABNORMAL HIGH (ref 6–23)
CO2: 30 mEq/L (ref 19–32)
Calcium: 9.5 mg/dL (ref 8.4–10.5)
Chloride: 91 mEq/L — ABNORMAL LOW (ref 96–112)
Creatinine, Ser: 1.02 mg/dL (ref 0.50–1.10)
Glucose, Bld: 136 mg/dL — ABNORMAL HIGH (ref 70–99)

## 2012-10-30 LAB — TROPONIN I: Troponin I: 0.36 ng/mL (ref <0.30)

## 2012-10-30 LAB — MAGNESIUM: Magnesium: 2 mg/dL (ref 1.5–2.5)

## 2012-10-30 LAB — PROTIME-INR: Prothrombin Time: 15.5 seconds — ABNORMAL HIGH (ref 11.6–15.2)

## 2012-10-30 SURGERY — LEFT HEART CATHETERIZATION WITH CORONARY ANGIOGRAM
Anesthesia: LOCAL

## 2012-10-30 MED ORDER — ACETAMINOPHEN 325 MG PO TABS: 650.0000 mg | ORAL_TABLET | ORAL | Status: DC | PRN

## 2012-10-30 MED ORDER — ALBUTEROL SULFATE (5 MG/ML) 0.5% IN NEBU
5.0000 mg | INHALATION_SOLUTION | Freq: Once | RESPIRATORY_TRACT | Status: AC
  Administered 2012-10-30: 5 mg via RESPIRATORY_TRACT
  Filled 2012-10-30: qty 1

## 2012-10-30 MED ORDER — ASPIRIN EC 81 MG PO TBEC
81.0000 mg | DELAYED_RELEASE_TABLET | Freq: Every day | ORAL | Status: DC
  Administered 2012-10-31 – 2012-11-06 (×7): 81 mg via ORAL
  Filled 2012-10-30 (×7): qty 1

## 2012-10-30 MED ORDER — IOHEXOL 350 MG/ML SOLN
100.0000 mL | Freq: Once | INTRAVENOUS | Status: AC | PRN
  Administered 2012-10-30: 80 mL via INTRAVENOUS

## 2012-10-30 MED ORDER — HEPARIN (PORCINE) IN NACL 100-0.45 UNIT/ML-% IJ SOLN
650.0000 [IU]/h | INTRAMUSCULAR | Status: DC
  Administered 2012-10-30: 650 [IU]/h via INTRAVENOUS
  Filled 2012-10-30: qty 250

## 2012-10-30 MED ORDER — NITROGLYCERIN IN D5W 200-5 MCG/ML-% IV SOLN
5.0000 ug/min | INTRAVENOUS | Status: DC
  Administered 2012-10-30: 5 ug/min via INTRAVENOUS
  Filled 2012-10-30: qty 250

## 2012-10-30 MED ORDER — CLOPIDOGREL BISULFATE 75 MG PO TABS
300.0000 mg | ORAL_TABLET | Freq: Once | ORAL | Status: DC
  Filled 2012-10-30: qty 2

## 2012-10-30 MED ORDER — DEXTROSE 5 % IV SOLN
1.0000 g | INTRAVENOUS | Status: DC
  Administered 2012-10-30 – 2012-11-02 (×4): 1 g via INTRAVENOUS
  Filled 2012-10-30 (×5): qty 10

## 2012-10-30 MED ORDER — ASPIRIN 81 MG PO CHEW
324.0000 mg | CHEWABLE_TABLET | Freq: Once | ORAL | Status: AC
  Administered 2012-10-30: 324 mg via ORAL
  Filled 2012-10-30: qty 4

## 2012-10-30 MED ORDER — AZITHROMYCIN 500 MG IV SOLR
500.0000 mg | INTRAVENOUS | Status: DC
  Administered 2012-10-31 – 2012-11-02 (×3): 500 mg via INTRAVENOUS
  Filled 2012-10-30 (×4): qty 500

## 2012-10-30 MED ORDER — ASPIRIN 300 MG RE SUPP
300.0000 mg | RECTAL | Status: AC
  Filled 2012-10-30: qty 1

## 2012-10-30 MED ORDER — ASPIRIN 81 MG PO CHEW: 324.0000 mg | CHEWABLE_TABLET | ORAL | Status: AC

## 2012-10-30 MED ORDER — SODIUM CHLORIDE 0.9 % IV SOLN: INTRAVENOUS | Status: DC

## 2012-10-30 MED ORDER — IPRATROPIUM BROMIDE 0.02 % IN SOLN
0.5000 mg | Freq: Once | RESPIRATORY_TRACT | Status: AC
  Administered 2012-10-30: 0.5 mg via RESPIRATORY_TRACT
  Filled 2012-10-30: qty 2.5

## 2012-10-30 MED ORDER — SODIUM CHLORIDE 0.9 % IV SOLN: 250.0000 mL | INTRAVENOUS | Status: DC | PRN

## 2012-10-30 MED ORDER — SODIUM CHLORIDE 0.9 % IV SOLN
INTRAVENOUS | Status: DC
  Administered 2012-10-31: 04:00:00 via INTRAVENOUS

## 2012-10-30 MED ORDER — ATORVASTATIN CALCIUM 80 MG PO TABS
80.0000 mg | ORAL_TABLET | Freq: Every day | ORAL | Status: DC
  Administered 2012-10-31 – 2012-11-05 (×6): 80 mg via ORAL
  Filled 2012-10-30 (×7): qty 1

## 2012-10-30 MED ORDER — METOPROLOL TARTRATE 12.5 MG HALF TABLET: 6.2500 mg | ORAL_TABLET | Freq: Two times a day (BID) | ORAL | Status: DC

## 2012-10-30 MED ORDER — NITROGLYCERIN 0.4 MG SL SUBL: 0.4000 mg | SUBLINGUAL_TABLET | SUBLINGUAL | Status: DC | PRN

## 2012-10-30 MED ORDER — LIDOCAINE HCL (PF) 1 % IJ SOLN
INTRAMUSCULAR | Status: AC
  Filled 2012-10-30: qty 30

## 2012-10-30 MED ORDER — HEPARIN (PORCINE) IN NACL 2-0.9 UNIT/ML-% IJ SOLN
INTRAMUSCULAR | Status: AC
  Filled 2012-10-30: qty 1000

## 2012-10-30 MED ORDER — FENTANYL CITRATE 0.05 MG/ML IJ SOLN
INTRAMUSCULAR | Status: AC
  Filled 2012-10-30: qty 2

## 2012-10-30 MED ORDER — SODIUM CHLORIDE 0.9 % IJ SOLN: 3.0000 mL | INTRAMUSCULAR | Status: DC | PRN

## 2012-10-30 MED ORDER — METOPROLOL TARTRATE 25 MG/10 ML ORAL SUSPENSION
6.2500 mg | Freq: Two times a day (BID) | ORAL | Status: DC
  Administered 2012-10-31 – 2012-11-01 (×4): 6.25 mg via ORAL
  Filled 2012-10-30 (×7): qty 2.5

## 2012-10-30 MED ORDER — HEPARIN BOLUS VIA INFUSION
3000.0000 [IU] | Freq: Once | INTRAVENOUS | Status: AC
  Administered 2012-10-30: 3000 [IU] via INTRAVENOUS

## 2012-10-30 MED ORDER — ONDANSETRON HCL 4 MG/2ML IJ SOLN: 4.0000 mg | Freq: Four times a day (QID) | INTRAMUSCULAR | Status: DC | PRN

## 2012-10-30 MED ORDER — SODIUM CHLORIDE 0.9 % IJ SOLN
3.0000 mL | Freq: Two times a day (BID) | INTRAMUSCULAR | Status: DC
  Administered 2012-10-31 – 2012-11-06 (×8): 3 mL via INTRAVENOUS

## 2012-10-30 MED ORDER — ONDANSETRON HCL 4 MG/2ML IJ SOLN
4.0000 mg | Freq: Four times a day (QID) | INTRAMUSCULAR | Status: DC | PRN
  Administered 2012-10-31 – 2012-11-04 (×3): 4 mg via INTRAVENOUS
  Filled 2012-10-30 (×3): qty 2

## 2012-10-30 MED ORDER — ASPIRIN 81 MG PO CHEW: 324.0000 mg | CHEWABLE_TABLET | ORAL | Status: DC

## 2012-10-30 MED ORDER — GUAIFENESIN-CODEINE 100-10 MG/5ML PO SOLN
ORAL | Status: AC
  Filled 2012-10-30: qty 5

## 2012-10-30 NOTE — ED Notes (Signed)
Pt resting comfortably in bed reading a book. States 3/10 cp.

## 2012-10-30 NOTE — ED Notes (Signed)
Cath lab calling, ready for patient.

## 2012-10-30 NOTE — ED Notes (Signed)
Admitting MD at bedside.

## 2012-10-30 NOTE — H&P (Signed)
Victoria Kennedy is an 67 y.o. female.   Chief Complaint: Chest pain/elevated troponin I. HPI: 67 year old female with past medical history significant for COPD, tobacco abuse, positive family history of coronary artery disease, came to ER complaining of retrosternal chest pressure radiating across the chest and neck and right shoulder associated with mild shortness of breath and nausea which woke her up around 1 AM did not seek any medical attention until this morning . Patient also complains of cough with yellowish mucus off and on for last 1 week associated with fever. Her EKG done in the ER showed normal sinus rhythm with poor R-wave progression in V1 to V3 and ST T-wave changes and was noted to have mildly elevated troponin I. Patient denies any such episodes of chest pain in the past denies any palpitation lightheadedness or syncope.  Past Medical History  Diagnosis Date  . COPD (chronic obstructive pulmonary disease)     Past Surgical History  Procedure Date  . I&d extremity 10/22/2011    Procedure: IRRIGATION AND DEBRIDEMENT EXTREMITY;  Surgeon: Karen Chafe, MD;  Location: [facility] OR;  Service: Orthopedics;  Laterality: Left;  I&D Left Thumb with Skin Flap    No family history on file. Social History:  reports that she has been smoking.  She does not have any smokeless tobacco history on file. She reports that she does not drink alcohol or use illicit drugs.  Allergies:  Allergies  Allergen Reactions  . Benadryl (Diphenhydramine Hcl) Itching  . Tylenol (Acetaminophen) Other (See Comments)    Tylenol #3: unknown reaction     (Not in a hospital admission)  Results for orders placed during the hospital encounter of 10/30/12 (from the past 48 hour(s))  CBC WITH DIFFERENTIAL     Status: Abnormal   Collection Time   10/30/12  9:40 AM      Component Value Range Comment   WBC 8.0  4.0 - 10.5 K/uL    RBC 4.93  3.87 - 5.11 MIL/uL    Hemoglobin 15.7 (*) 12.0 - 15.0 g/dL    HCT 29.5  (*) 62.1 - 46.0 %    MCV 93.9  78.0 - 100.0 fL    MCH 31.8  26.0 - 34.0 pg    MCHC 33.9  30.0 - 36.0 g/dL    RDW 30.8  65.7 - 84.6 %    Platelets 216  150 - 400 K/uL    Neutrophils Relative 68  43 - 77 %    Neutro Abs 5.5  1.7 - 7.7 K/uL    Lymphocytes Relative 10 (*) 12 - 46 %    Lymphs Abs 0.8  0.7 - 4.0 K/uL    Monocytes Relative 20 (*) 3 - 12 %    Monocytes Absolute 1.6 (*) 0.1 - 1.0 K/uL    Eosinophils Relative 0  0 - 5 %    Eosinophils Absolute 0.0  0.0 - 0.7 K/uL    Basophils Relative 1  0 - 1 %    Basophils Absolute 0.1  0.0 - 0.1 K/uL   BASIC METABOLIC PANEL     Status: Abnormal   Collection Time   10/30/12  9:40 AM      Component Value Range Comment   Sodium 132 (*) 135 - 145 mEq/L    Potassium 4.0  3.5 - 5.1 mEq/L    Chloride 91 (*) 96 - 112 mEq/L    CO2 30  19 - 32 mEq/L    Glucose, Bld 136 (*)  70 - 99 mg/dL    BUN 25 (*) 6 - 23 mg/dL    Creatinine, Ser 1.61  0.50 - 1.10 mg/dL    Calcium 9.5  8.4 - 09.6 mg/dL    GFR calc non Af Amer 56 (*) >90 mL/min    GFR calc Af Amer 65 (*) >90 mL/min   TROPONIN I     Status: Abnormal   Collection Time   10/30/12  9:45 AM      Component Value Range Comment   Troponin I 0.63 (*) <0.30 ng/mL   PROTIME-INR     Status: Normal   Collection Time   10/30/12 10:53 AM      Component Value Range Comment   Prothrombin Time 14.5  11.6 - 15.2 seconds    INR 1.15  0.00 - 1.49   APTT     Status: Normal   Collection Time   10/30/12 10:53 AM      Component Value Range Comment   aPTT 29  24 - 37 seconds   D-DIMER, QUANTITATIVE     Status: Abnormal   Collection Time   10/30/12 10:53 AM      Component Value Range Comment   D-Dimer, Quant 5.43 (*) 0.00 - 0.48 ug/mL-FEU    Dg Chest 2 View  10/30/2012  *RADIOLOGY REPORT*  Clinical Data: Cough and fever.  CHEST - 2 VIEW  Comparison: 02/15/2004  Findings: There is some progression of chronic lung disease since the prior study with mild hyperinflation and diffuse pulmonary interstitial prominence  present.  No focal pulmonary consolidation, nodule or pneumothorax is identified.  No edema or pleural fluid. The heart size is normal.  The bony thorax shows osteopenia.  IMPRESSION: Progression of chronic lung disease.  No focal acute findings.   Original Report Authenticated By: Irish Lack, M.D.     Review of Systems  Constitutional: Positive for fever. Negative for chills.  Eyes: Negative for blurred vision and double vision.  Respiratory: Positive for cough, sputum production and shortness of breath. Negative for hemoptysis.   Cardiovascular: Positive for chest pain. Negative for palpitations, orthopnea, leg swelling and PND.  Gastrointestinal: Positive for nausea. Negative for abdominal pain.  Genitourinary: Negative for dysuria.  Neurological: Negative for dizziness, tingling and headaches.    Blood pressure 115/70, pulse 97, temperature 98.1 F (36.7 C), temperature source Oral, resp. rate 20, height 5\' 2"  (1.575 m), weight 52.164 kg (115 lb), SpO2 92.00%. Physical Exam  HENT:  Head: Normocephalic and atraumatic.  Eyes: Conjunctivae normal are normal. Pupils are equal, round, and reactive to light.  Neck: Normal range of motion. Neck supple. No JVD present. No tracheal deviation present. No thyromegaly present.  Cardiovascular:       Regular rate and rhythm S1-S2 soft there is soft systolic murmur no S3 gallop  Respiratory:       Decreased breath sound at bases  GI: Soft. Bowel sounds are normal. She exhibits no distension. There is no tenderness. There is no rebound.  Musculoskeletal: She exhibits no edema and no tenderness.  Neurological: She is alert.     Assessment/Plan Acute coronary syndrome Bronchitis rule out pneumonia COPD Tobacco abuse Positive family history of coronary artery disease Osteopenia Degenerative joint disease Plan As per orders Discussed with patient regarding left cath possible PTCA stenting its risk and benefits i.e. death MI stroke need  for emergency CABG etc. and consents for PCI [facility] N 10/30/2012, 12:20 PM

## 2012-10-30 NOTE — ED Notes (Signed)
Patient transported to CT 

## 2012-10-30 NOTE — Progress Notes (Signed)
ANTICOAGULATION CONSULT NOTE - Initial Consult  Pharmacy Consult for Heparin Indication: chest pain/ACS  Allergies  Allergen Reactions  . Benadryl (Diphenhydramine Hcl) Itching  . Tylenol (Acetaminophen) Other (See Comments)    Tylenol #3: unknown reaction    Patient Measurements: Height: 5\' 2"  (157.5 cm) Weight: 115 lb (52.164 kg) IBW/kg (Calculated) : 50.1  Heparin Dosing Weight: 52 kg  Vital Signs: Temp: 98.1 Kennedy (36.7 C) (02/07 0923) Temp src: Oral (02/07 0923) BP: 115/70 mmHg (02/07 1119) Pulse Rate: 97  (02/07 1119)  Labs:  Basename 10/30/12 1053 10/30/12 0945 10/30/12 0940  HGB -- -- 15.7*  HCT -- -- 46.3*  PLT -- -- 216  APTT 29 -- --  LABPROT 14.5 -- --  INR 1.15 -- --  HEPARINUNFRC -- -- --  CREATININE -- -- 1.02  CKTOTAL -- -- --  CKMB -- -- --  TROPONINI -- 0.63* --    Estimated Creatinine Clearance: 42.9 ml/min (by C-G formula based on Cr of 1.02).   Medical History: Past Medical History  Diagnosis Date  . COPD (chronic obstructive pulmonary disease)     Medications:  Tylenol PRN ASA 81 daily  Assessment: 67 yo Kennedy with relatively little PMH presents to the ED 10/30/2012 with SOB, subjective fever, and aching CP.  Differential diagnosis of PNA vs. CP.  Initial troponin elevated 0.63, D-dimer also elevated 5.43.  Pt is a current smoker.      Goal of Therapy:  Heparin level 0.3-0.7 units/ml Monitor platelets by anticoagulation protocol: Yes   Plan:  Heparin 3000 unit IV bolus x 1 Heparin infusion at 650 units/hr. Heparin level in 6 hours. Heparin level and CBC daily while on heparin.  [employer], Pharm.D., BCPS Clinical Pharmacist Pager [phone] 10/30/2012 12:24 PM

## 2012-10-30 NOTE — ED Notes (Addendum)
Pt states for a week chest pain, cough productive brown/gray sputum. States fever for past few days and vomiting since last night. Sitting upright speaking in clear and concise sentences. Pt oxygen sats 80% on RA, provided 2 L, increased to 89%. Pt has dusky skin appearance.

## 2012-10-30 NOTE — ED Provider Notes (Signed)
History     CSN: [id number]  Arrival date & time 10/30/12  0902   First MD Initiated Contact with Patient 10/30/12 [phone]      Chief Complaint  Patient presents with  . Chest Pain  . Pneumonia    (Consider location/radiation/quality/duration/timing/severity/associated sxs/prior treatment) HPI Pt with history of COPD and tobacco abuse who does not follow with any doctors reports she has had a week of productive cough, SOB, subjective fever which were worse this morning, and associated moderate aching chest pain worse with coughing.   Past Medical History  Diagnosis Date  . COPD (chronic obstructive pulmonary disease)     Past Surgical History  Procedure Date  . I&d extremity 10/22/2011    Procedure: IRRIGATION AND DEBRIDEMENT EXTREMITY;  Surgeon: Karen Chafe, MD;  Location: [facility] OR;  Service: Orthopedics;  Laterality: Left;  I&D Left Thumb with Skin Flap    No family history on file.  History  Substance Use Topics  . Smoking status: Current Every Day Smoker  . Smokeless tobacco: Not on file  . Alcohol Use: No    OB History    Grav Para Term Preterm Abortions TAB SAB Ect Mult Living                  Review of Systems All other systems reviewed and are negative except as noted in HPI.   Allergies  Benadryl and Tylenol  Home Medications   Current Outpatient Rx  Name  Route  Sig  Dispense  Refill  . ACETAMINOPHEN 325 MG PO TABS   Oral   Take 650 mg by mouth every 6 (six) hours as needed. For pain/fever         . ASPIRIN EC 81 MG PO TBEC   Oral   Take 81 mg by mouth daily.           BP 123/82  Pulse 101  Temp 98.1 F (36.7 C) (Oral)  Resp 21  SpO2 90%  Physical Exam  Nursing note and vitals reviewed. Constitutional: She is oriented to person, place, and time. She appears well-developed and well-nourished.  HENT:  Head: Normocephalic and atraumatic.  Eyes: EOM are normal. Pupils are equal, round, and reactive to light.  Neck: Normal range  of motion. Neck supple.  Cardiovascular: Normal rate, normal heart sounds and intact distal pulses.   Pulmonary/Chest: Effort normal. She has no wheezes. She has no rales.       Decreased air movement  Abdominal: Bowel sounds are normal. She exhibits no distension. There is no tenderness.  Musculoskeletal: Normal range of motion. She exhibits no edema and no tenderness.  Neurological: She is alert and oriented to person, place, and time. She has normal strength. No cranial nerve deficit or sensory deficit.  Skin: Skin is warm and dry. No rash noted.  Psychiatric: She has a normal mood and affect.    ED Course  Procedures (including critical care time)  Labs Reviewed  CBC WITH DIFFERENTIAL - Abnormal; Notable for the following:    Hemoglobin 15.7 (*)     HCT 46.3 (*)     Lymphocytes Relative 10 (*)     Monocytes Relative 20 (*)     Monocytes Absolute 1.6 (*)     All other components within normal limits  BASIC METABOLIC PANEL - Abnormal; Notable for the following:    Sodium 132 (*)     Chloride 91 (*)     Glucose, Bld 136 (*)  BUN 25 (*)     GFR calc non Af Amer 56 (*)     GFR calc Af Amer 65 (*)     All other components within normal limits  TROPONIN I - Abnormal; Notable for the following:    Troponin I 0.63 (*)     All other components within normal limits  PROTIME-INR  APTT  D-DIMER, QUANTITATIVE   Dg Chest 2 View  10/30/2012  *RADIOLOGY REPORT*  Clinical Data: Cough and fever.  CHEST - 2 VIEW  Comparison: 02/15/2004  Findings: There is some progression of chronic lung disease since the prior study with mild hyperinflation and diffuse pulmonary interstitial prominence present.  No focal pulmonary consolidation, nodule or pneumothorax is identified.  No edema or pleural fluid. The heart size is normal.  The bony thorax shows osteopenia.  IMPRESSION: Progression of chronic lung disease.  No focal acute findings.   Original Report Authenticated By: Irish Lack, M.D.       No diagnosis found.    MDM   Date: 10/30/2012  Rate: 93   Rhythm: normal sinus rhythm diffuse artifact  QRS Axis: normal  Intervals: normal  ST/T Wave abnormalities: nonspecific T wave changes  Conduction Disutrbances:none  Narrative Interpretation:   Old EKG Reviewed: none available  EKG delay due to confusion over orders. No evidence of STEMI but patient does have borderline troponin. CXR is neg for pneumonia. Consider ACS vs PE. Will add d-dimer, start heparin drip. Discussed with Dr. Sharyn Lull who asked that NTG drip be initiated as well   1:57 PM D-dimer elevated patient send for CTA to rule out PE. Dr. Sharyn Lull aware.   CRITICAL CARE Performed by: Pollyann Savoy   Total critical care time: 45  Critical care time was exclusive of separately billable procedures and treating other patients.  Critical care was necessary to treat or prevent imminent or life-threatening deterioration.  Critical care was time spent personally by me on the following activities: development of treatment plan with patient and/or surrogate as well as nursing, discussions with consultants, evaluation of patient's response to treatment, examination of patient, obtaining history from patient or surrogate, ordering and performing treatments and interventions, ordering and review of laboratory studies, ordering and review of radiographic studies, pulse oximetry and re-evaluation of patient's condition.      Charles B. Bernette Mayers, MD 10/30/12 1358

## 2012-10-30 NOTE — Cardiovascular Report (Signed)
[name], LOSS               ACCOUNT NO.:  [id number]  MEDICAL RECORD NO.:  [id number]  LOCATION:  [id number]                        FACILITY:  [facility]  PHYSICIAN:  Victoria Kennedy, M.D. DATE OF BIRTH:  May 29, 1946  DATE OF PROCEDURE:  10/30/2012 DATE OF DISCHARGE:                           CARDIAC CATHETERIZATION   PROCEDURE:  Left cardiac cath with selective left and right coronary angiography, left ventriculography via right groin using Judkins technique.  INDICATION FOR THE PROCEDURE:  Victoria Kennedy is a 67 year old female with past medical history significant for COPD, tobacco abuse, and positive family history of coronary artery disease.  She came to the ER complaining of retrosternal chest pain described as pressure radiating across the chest and neck and right shoulder associated with mild shortness of breath and nausea, which woke her up around 1:00 am and did not seek any medical attention until this morning.  The patient also complains of cough with yellowish mucus off and on for last 1 week associated with fever.  EKG done in the ER showed normal sinus rhythm with poor R-wave progression in V1 to V3 and ST-T wave changes and was noted to have mildly elevated troponin-I of 0.63.  The patient also was noted to have elevated D-dimer in the ER and subsequently had spiral CT of the chest which was negative for pulmonary embolism.  Due to typical anginal chest pain, multiple risk factors, mildly elevated troponin-I discussed with the patient regarding left cath, possible PTCA stenting, its risks and benefits, i.e., death, MI, stroke, need for emergency CABG, local vascular complications, et Karie Soda and consented for PCI procedure.  DESCRIPTION OF PROCEDURE:  After obtaining the informed consent, the patient was brought to the cath lab and was placed on fluoroscopy table. Right groin was prepped and draped in usual fashion.  Xylocaine 1% was used for local anesthesia in the right  groin.  With the help of thin wall needle, 5-French arterial sheath was placed.  The sheath was aspirated and flushed.  Next, 5-French left Judkins catheter was advanced over the wire under fluoroscopic guidance up to the ascending aorta.  Wire was pulled out.  The catheter was aspirated and connected to the Manifold.  Catheter was further advanced and engaged into left coronary ostium.  Multiple views of the left system were taken.  Next, catheter was disengaged and was pulled out over the wire and was replaced with 5-French right Judkins catheter, which was advanced over the wire under fluoroscopic guidance up to the ascending aorta.  Wire was pulled out.  The catheter was aspirated and connected to the Manifold.  Catheter was further advanced and engaged into right coronary ostium.  Multiple views of the right system were taken.  Next, the catheter was disengaged and was pulled out over the wire and was replaced with 5-French pigtail catheter, which was advanced over the wire under fluoroscopic guidance up to the ascending aorta.  Wire was pulled out.  The catheter was aspirated and connected to the Manifold. Catheter was further advanced across the aortic valve into the LV.  LV pressures were recorded.  LV graft was done in 30-degree RAO position.  Post-angiographic pressures were recorded from LV  and then pullback pressures were recorded from the aorta.  There was no gradient across the aortic valve.  Next, pigtail catheter was pulled out over the wire.  Sheaths were aspirated and flushed.  FINDINGS:  LV showed good LV systolic function.  EF of 55-60%.  Left main was patent.  LAD has 10-15% proximal stenosis.  Diagonal 1 was small, which was patent.  Diagonal 2 was very, very small.  Left circumflex was small, which was patent.  High OM 1 was moderate size, which has 20-25% proximal and mid stenosis.  OM 2 and OM 3 were very small.  RCA has a 30-40% proximal and 20-30%  mid stenosis.  PDA and PLV branches were very small.  The patient tolerated the procedure well.  Arteriotomy was closed with Perclose without complications.  The patient was transferred to recovery room in stable condition.     Victoria Kennedy. Victoria Kennedy, M.D.     [facility]  D:  10/30/2012  T:  10/30/2012  Job:  [id number]

## 2012-10-30 NOTE — ED Notes (Signed)
Patient transported to X-ray 

## 2012-10-30 NOTE — ED Notes (Signed)
All belongings will be sent with patient sister, Darel Hong when patient goes to cath lab.

## 2012-10-30 NOTE — ED Notes (Signed)
Pt returned from CT °

## 2012-10-31 LAB — LIPID PANEL: Cholesterol: 121 mg/dL (ref 0–200)

## 2012-10-31 LAB — BASIC METABOLIC PANEL
Calcium: 9.2 mg/dL (ref 8.4–10.5)
GFR calc Af Amer: >90 mL/min (ref >90)
GFR calc non Af Amer: >90 mL/min (ref >90)
Potassium: 4.3 mEq/L (ref 3.5–5.1)
Sodium: 135 mEq/L (ref 135–145)

## 2012-10-31 LAB — CBC
MCV: 96.2 fL (ref 78.0–100.0)
Platelets: 177 10*3/uL (ref 150–400)
RBC: 4.5 MIL/uL (ref 3.87–5.11)
RDW: 13.5 % (ref 11.5–15.5)
WBC: 7 10*3/uL (ref 4.0–10.5)

## 2012-10-31 LAB — TROPONIN I: Troponin I: <0.3 ng/mL (ref <0.30)

## 2012-10-31 MED ORDER — ENOXAPARIN SODIUM 30 MG/0.3ML ~~LOC~~ SOLN: 30.0000 mg | SUBCUTANEOUS | Status: DC

## 2012-10-31 MED ORDER — ENOXAPARIN SODIUM 40 MG/0.4ML ~~LOC~~ SOLN
40.0000 mg | SUBCUTANEOUS | Status: DC
  Administered 2012-10-31 – 2012-11-03 (×4): 40 mg via SUBCUTANEOUS
  Filled 2012-10-31 (×7): qty 0.4

## 2012-10-31 MED ORDER — INSULIN ASPART 100 UNIT/ML ~~LOC~~ SOLN: 0.0000 [IU] | Freq: Three times a day (TID) | SUBCUTANEOUS | Status: DC

## 2012-10-31 MED ORDER — LEVALBUTEROL HCL 1.25 MG/0.5ML IN NEBU
1.2500 mg | INHALATION_SOLUTION | Freq: Three times a day (TID) | RESPIRATORY_TRACT | Status: DC
  Administered 2012-10-31 – 2012-11-06 (×15): 1.25 mg via RESPIRATORY_TRACT
  Filled 2012-10-31 (×24): qty 0.5

## 2012-10-31 NOTE — Progress Notes (Signed)
Subjective:  Patient denies any further chest pain states still has significant cough with whitish yellow mucus. Feels tired and fatigued   Objective:  Vital Signs in the last 24 hours: Temp:  [97.6 F (36.4 C)-98.4 F (36.9 C)] 97.6 F (36.4 C) (02/08 0500) Pulse Rate:  [67-112] 112 (02/08 1049) Resp:  [18-27] 18 (02/08 0500) BP: (99-139)/(63-94) 121/81 mmHg (02/08 1049) SpO2:  [93 %-100 %] 97 % (02/08 0500) Weight:  [52.164 kg (115 lb)] 52.164 kg (115 lb) (02/07 1217)  Intake/Output from previous day: 02/07 0701 - 02/08 0700 In: 480 [P.O.:480] Out: 275 [Urine:275] Intake/Output from this shift:    Physical Exam: Neck: no adenopathy, no carotid bruit, no JVD and supple, symmetrical, trachea midline Lungs: Decreased breath sound at bases Heart: regular rate and rhythm, S1, S2 normal and No S3 or S4 gallop noted Abdomen: soft, non-tender; bowel sounds normal; no masses,  no organomegaly Extremities: extremities normal, atraumatic, no cyanosis or edema and Right groin no evidence of hematoma or bruit  Lab Results:  Recent Labs  10/30/12 1902 10/31/12 0615  WBC 6.2 7.0  HGB 13.9 14.0  PLT 185 177    Recent Labs  10/30/12 1902 10/31/12 0615  NA 134* 135  K 4.3 4.3  CL 95* 96  CO2 30 30  GLUCOSE 116* 154*  BUN 21 18  CREATININE 0.81 0.66    Recent Labs  10/31/12 0001 10/31/12 0615  TROPONINI 0.39* <0.30   Hepatic Function Panel  Recent Labs  10/30/12 1902  PROT 7.0  ALBUMIN 2.8*  AST 881*  ALT 646*  ALKPHOS 160*  BILITOT 0.3    Recent Labs  10/31/12 0615  CHOL 121   No results found for this basename: PROTIME,  in the last 72 hours  Imaging: Imaging results have been reviewed and Dg Chest 2 View  10/30/2012  *RADIOLOGY REPORT*  Clinical Data: Cough and fever.  CHEST - 2 VIEW  Comparison: 02/15/2004  Findings: There is some progression of chronic lung disease since the prior study with mild hyperinflation and diffuse pulmonary interstitial  prominence present.  No focal pulmonary consolidation, nodule or pneumothorax is identified.  No edema or pleural fluid. The heart size is normal.  The bony thorax shows osteopenia.  IMPRESSION: Progression of chronic lung disease.  No focal acute findings.   Original Report Authenticated By: Irish Lack, M.D.    Ct Angio Chest Pe W/cm &/or Wo Cm  10/30/2012  *RADIOLOGY REPORT*  Clinical Data: Chest pain, shortness of breath and elevated D- dimer.  CT ANGIOGRAPHY CHEST  Technique:  Multidetector CT imaging of the chest using the standard protocol during bolus administration of intravenous contrast. Multiplanar reconstructed images including MIPs were obtained and reviewed to evaluate the vascular anatomy.  Contrast: 80mL OMNIPAQUE IOHEXOL 350 MG/ML SOLN  Comparison: None.  Findings: The pulmonary arteries are adequately opacified and show no evidence of pulmonary embolism.  Mildly prominent bilateral hilar lymph node tissue is present without overt lymph node enlargement.  This may be on a reactive basis secondary to chronic lung disease.  Both lungs demonstrate interstitial septal thickening and scattered areas of parenchymal scarring.  No overt pulmonary fibrosis is identified. A few peripheral right upper lobe bronchi are mildly dilated, consistent with mild bronchiectasis. There is no evidence of pulmonary consolidation.  No pulmonary edema, pneumothorax, pleural fluid or pericardial fluid is seen.  Heart size is normal.  No pulmonary masses.  Bony thorax shows no abnormalities.  IMPRESSION: Chronic lung disease with  mild bronchiectasis in the right upper lobe.  No evidence of acute pulmonary embolism or focal pulmonary consolidation.   Original Report Authenticated By: Irish Lack, M.D.     Cardiac Studies:  Assessment/Plan:  Status post probable very small non-Q-wave myocardial infarction status post left cath COPD with mild bronchiectasis improving History of tobacco  abuse Osteopenia Degenerative joint disease Plan Continue IV antibiotics Increase ambulation  LOS: 1 day    Victoria Kennedy N 10/31/2012, 11:52 AM

## 2012-11-01 ENCOUNTER — Encounter (HOSPITAL_COMMUNITY): Payer: Self-pay | Admitting: *Deleted

## 2012-11-01 LAB — CBC
HCT: 40.4 % (ref 36.0–46.0)
Hemoglobin: 12.7 g/dL (ref 12.0–15.0)
MCH: 30.5 pg (ref 26.0–34.0)
MCV: 97.1 fL (ref 78.0–100.0)
Platelets: 171 10*3/uL (ref 150–400)
RBC: 4.16 MIL/uL (ref 3.87–5.11)
WBC: 7.3 10*3/uL (ref 4.0–10.5)

## 2012-11-01 LAB — BASIC METABOLIC PANEL
CO2: 33 mEq/L — ABNORMAL HIGH (ref 19–32)
Chloride: 98 mEq/L (ref 96–112)
GFR calc non Af Amer: >90 mL/min (ref >90)
Glucose, Bld: 101 mg/dL — ABNORMAL HIGH (ref 70–99)
Potassium: 4.4 mEq/L (ref 3.5–5.1)
Sodium: 137 mEq/L (ref 135–145)

## 2012-11-01 LAB — GLUCOSE, CAPILLARY
Glucose-Capillary: 102 mg/dL — ABNORMAL HIGH (ref 70–99)
Glucose-Capillary: 112 mg/dL — ABNORMAL HIGH (ref 70–99)
Glucose-Capillary: 122 mg/dL — ABNORMAL HIGH (ref 70–99)

## 2012-11-01 MED ORDER — ENSURE PUDDING PO PUDG
1.0000 | Freq: Three times a day (TID) | ORAL | Status: DC
  Administered 2012-11-01 – 2012-11-05 (×3): 1 via ORAL

## 2012-11-01 NOTE — Progress Notes (Signed)
Subjective:  Complains of cough with whitish yellow phlegm. States appetite is poor. Denies any chest pain  Objective:  Vital Signs in the last 24 hours: Temp:  [97.9 F (36.6 C)-98.5 F (36.9 C)] 97.9 F (36.6 C) (02/09 1316) Pulse Rate:  [86-101] 101 (02/09 1316) Resp:  [16-20] 17 (02/09 1316) BP: (110-116)/(60-75) 116/65 mmHg (02/09 1316) SpO2:  [88 %-95 %] 91 % (02/09 1316)  Intake/Output from previous day: 02/08 0701 - 02/09 0700 In: 120 [P.O.:120] Out: 300 [Urine:300] Intake/Output from this shift:    Physical Exam: Neck: no adenopathy, no carotid bruit, no JVD and supple, symmetrical, trachea midline Lungs: Decreased breath sound at bases with occasional rhonchi Heart: regular rate and rhythm, S1, S2 normal, no murmur, click, rub or gallop Abdomen: soft, non-tender; bowel sounds normal; no masses,  no organomegaly Extremities: extremities normal, atraumatic, no cyanosis or edema  Lab Results:  Recent Labs  10/31/12 0615 11/01/12 0540  WBC 7.0 7.3  HGB 14.0 12.7  PLT 177 171    Recent Labs  10/31/12 0615 11/01/12 0540  NA 135 137  K 4.3 4.4  CL 96 98  CO2 30 33*  GLUCOSE 154* 101*  BUN 18 11  CREATININE 0.66 0.58    Recent Labs  10/31/12 0001 10/31/12 0615  TROPONINI 0.39* <0.30   Hepatic Function Panel  Recent Labs  10/30/12 1902  PROT 7.0  ALBUMIN 2.8*  AST 881*  ALT 646*  ALKPHOS 160*  BILITOT 0.3    Recent Labs  10/31/12 0615  CHOL 121   No results found for this basename: PROTIME,  in the last 72 hours  Imaging: Imaging results have been reviewed and No results found.  Cardiac Studies:  Assessment/Plan:  Status post probable very small non-Q-wave myocardial infarction status post left cath  COPD with mild bronchiectasis improving  History of tobacco abuse  Osteopenia  Degenerative joint disease  Plan Continue present management Ensure Plus as per orders Increase ambulation OT PT consult  LOS: 2 days     Victoria Kennedy N 11/01/2012, 2:28 PM

## 2012-11-02 LAB — GLUCOSE, CAPILLARY
Glucose-Capillary: 112 mg/dL — ABNORMAL HIGH (ref 70–99)
Glucose-Capillary: 127 mg/dL — ABNORMAL HIGH (ref 70–99)
Glucose-Capillary: 98 mg/dL (ref 70–99)

## 2012-11-02 LAB — CBC
HCT: 38.3 % (ref 36.0–46.0)
Hemoglobin: 12 g/dL (ref 12.0–15.0)
MCH: 30.5 pg (ref 26.0–34.0)
MCHC: 31.3 g/dL (ref 30.0–36.0)
MCV: 97.2 fL (ref 78.0–100.0)

## 2012-11-02 MED ORDER — METOPROLOL TARTRATE 25 MG/10 ML ORAL SUSPENSION
6.2500 mg | Freq: Two times a day (BID) | ORAL | Status: DC
  Administered 2012-11-02 – 2012-11-06 (×7): 6.25 mg via ORAL
  Filled 2012-11-02 (×9): qty 2.5

## 2012-11-02 MED ORDER — BUDESONIDE-FORMOTEROL FUMARATE 160-4.5 MCG/ACT IN AERO
2.0000 | INHALATION_SPRAY | Freq: Two times a day (BID) | RESPIRATORY_TRACT | Status: DC
  Administered 2012-11-03 – 2012-11-06 (×5): 2 via RESPIRATORY_TRACT
  Filled 2012-11-02: qty 6

## 2012-11-02 NOTE — Evaluation (Signed)
Physical Therapy Evaluation Patient Details Name: Victoria Kennedy MRN: [id number] DOB: Sep 13, 1946 Today's Date: 11/02/2012 Time: 4132-4401 PT Time Calculation (min): 24 min  PT Assessment / Plan / Recommendation Clinical Impression  67 yo adm with MI and h/o COPD. Pt very lethargic this a.m. 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 Pt reporting recent decr balance. Per nursing, son reported she easily falls asleep and has fallen off toilet doing so. Pt may benefit from PT to incr safety with mobility prior to d/c home with family support.    PT Assessment  Patient needs continued PT services    Follow Up Recommendations  Home health PT;Supervision/Assistance - 24 hour         Barriers to Discharge None      Equipment Recommendations  Other (comment) (TBA)         Frequency Min 3X/week    Precautions / Restrictions Precautions Precautions: Fall Restrictions Other Position/Activity Restrictions: desaturates on O2   Pertinent Vitals/Pain SaO2 on 3L 89% on arrival (pt supine); up to EOB and SaO2 remained 89-90% on 3L; attempted pursed lip breathing with pt unable to attend due to lethargy; incr O2 to 4L with SaO2 at 92% with pt supine       Mobility  Bed Mobility Bed Mobility: Supine to Sit;Sitting - Scoot to [place] of Bed;Sit to Supine Supine to Sit: 4: Min assist;HOB elevated Sitting - Scoot to [place] of Bed: 4: Min guard Sit to Supine: 5: Supervision;HOB flat Details for Bed Mobility Assistance: pt lethargic and needing assist to initiate; slightly more alert on EOB, however still drifting off to sleep with SaO2 89-90% Transfers Transfers: Sit to Stand;Stand to Sit Sit to Stand: 4: Min guard;With upper extremity assist;From bed Stand to Sit: 4: Min guard;With upper extremity assist;To bed Details for Transfer Assistance: x3 to step up towards [facility] Ambulation/Gait Ambulation/Gait Assistance: Not tested (comment)          PT Diagnosis: Difficulty walking;Other (comment) (decr cardiopulmonary status)  PT Problem List: Decreased activity tolerance;Decreased balance;Decreased mobility;Decreased cognition;Decreased knowledge of use of DME;Decreased safety awareness;Cardiopulmonary status limiting activity PT Treatment Interventions: DME instruction;Gait training;Stair training;Functional mobility training;Therapeutic activities;Balance training;Cognitive remediation;Patient/family education   PT Goals Acute Rehab PT Goals PT Goal Formulation: With patient/family Time For Goal Achievement: 11/09/12 Potential to Achieve Goals: Good Pt will go Supine/Side to Sit: with modified independence;with HOB 0 degrees PT Goal: Supine/Side to Sit - Progress: Goal set today Pt will Sit at Edge of Bed: with supervision;1-2 min;with no upper extremity support PT Goal: Sit at Edge Of Bed - Progress: Goal set today Pt will go Sit to Supine/Side: with modified independence;with HOB 0 degrees PT Goal: Sit to Supine/Side - Progress: Goal set today Pt will go Sit to Stand: with supervision PT Goal: Sit to Stand - Progress: Goal set today Pt will Ambulate: 51 - 150 feet;with supervision;with least restrictive assistive device;Other (comment) (SaO2 >90%) PT Goal: Ambulate - Progress: Goal set today Pt will Go Up / Down Stairs: 3-5 stairs;with min assist;with least restrictive assistive device PT Goal: Up/Down Stairs - Progress: Goal set today  Visit Information  Last PT Received On: 11/02/12 Assistance Needed: +1    Subjective Data  Subjective: Sister reports pt used to work 3rd shift and still sleeps during day and is awake at night Patient Stated Goal: unable due to lethargy   Prior Functioning  Home Living Lives With: Son Available  Help at Discharge: Family;Available 24 hours/day Type of Home: House Home Access: Stairs to enter [employer] of Steps: 3 Entrance Stairs-Rails: None Home Layout: One  level Bathroom Shower/Tub: Tub/shower unit Home Adaptive Equipment: Other (comment) (walker-unsure what type; ) Additional Comments: sister reports walker was given to her and she has never used it; pt too lethargic to complete all questions Prior Function Level of Independence: Independent Comments: very lethargic and unable to attend to questions Communication Communication: Other (comment) (speech slightly slurred; ? lethargy)    Cognition  Cognition Overall Cognitive Status: Difficult to assess Difficult to assess due to: Level of arousal Arousal/Alertness: Lethargic Behavior During Session: Lethargic    Extremity/Trunk Assessment Right Lower Extremity Assessment RLE ROM/Strength/Tone: [facility] for tasks assessed Left Lower Extremity Assessment LLE ROM/Strength/Tone: [facility] for tasks assessed Trunk Assessment Trunk Assessment: Normal   Balance Balance Balance Assessed: Yes Static Sitting Balance Static Sitting - Balance Support: Bilateral upper extremity supported;Feet supported Static Sitting - Level of Assistance: 4: Min assist Static Sitting - Comment/# of Minutes: falling asleep and falling forward; twitching/jumping at EOB Static Standing Balance Static Standing - Balance Support: Left upper extremity supported Static Standing - Level of Assistance: 4: Min assist  End of Session PT - End of Session Equipment Utilized During Treatment: Oxygen Activity Tolerance: Patient limited by fatigue Patient left: in bed;with call bell/phone within reach;with bed alarm set;with family/visitor present Nurse Communication: Mobility status;Other (comment) (desaturates on 3L and incr to 4L)  GP     Langford Carias 11/02/2012, 9:26 AM Pager [phone]

## 2012-11-02 NOTE — Progress Notes (Signed)
Subjective:  Patient denies any chest pain breathing and coughing is gradually improving. O2 sats remains low in 80s on room air requiring 02 by nasal cannula to maintain sats above 92%  Objective:  Vital Signs in the last 24 hours: Temp:  [97.9 F (36.6 C)-98.2 F (36.8 C)] 98.2 F (36.8 C) (02/10 0500) Pulse Rate:  [80-101] 80 (02/10 1000) Resp:  [17-20] 18 (02/10 0500) BP: (95-134)/(60-78) 95/60 mmHg (02/10 1020) SpO2:  [91 %-97 %] 97 % (02/10 1019)  Intake/Output from previous day: 02/09 0701 - 02/10 0700 In: 6414 [I.V.:5760; IV Piggyback:654] Out: 500 [Urine:500] Intake/Output from this shift: Total I/O In: 240 [P.O.:240] Out: 100 [Urine:100]  Physical Exam: Neck: no adenopathy, no carotid bruit, no JVD and supple, symmetrical, trachea midline Lungs: Decreased breath sound at bases with occasional rhonchi Heart: regular rate and rhythm, S1, S2 normal and Soft systolic murmur noted no S3 gallop Abdomen: soft, non-tender; bowel sounds normal; no masses,  no organomegaly Extremities: extremities normal, atraumatic, no cyanosis or edema and Right groin stable  Lab Results:  Recent Labs  11/01/12 0540 11/02/12 0615  WBC 7.3 5.9  HGB 12.7 12.0  PLT 171 199    Recent Labs  10/31/12 0615 11/01/12 0540  NA 135 137  K 4.3 4.4  CL 96 98  CO2 30 33*  GLUCOSE 154* 101*  BUN 18 11  CREATININE 0.66 0.58    Recent Labs  10/31/12 0001 10/31/12 0615  TROPONINI 0.39* <0.30   Hepatic Function Panel  Recent Labs  10/30/12 1902  PROT 7.0  ALBUMIN 2.8*  AST 881*  ALT 646*  ALKPHOS 160*  BILITOT 0.3    Recent Labs  10/31/12 0615  CHOL 121   No results found for this basename: PROTIME,  in the last 72 hours  Imaging: Imaging results have been reviewed and No results found.  Cardiac Studies:  Assessment/Plan:  Status post probable very small non-Q-wave myocardial infarction status post left cath  COPD with mild bronchiectasis improving  History of  tobacco abuse  Osteopenia  Degenerative joint disease  Plan Continue present management Increase ambulation Start Symbicort inhaler as per orders  LOS: 3 days    Rahmir Beever N 11/02/2012, 1:08 PM

## 2012-11-02 NOTE — Progress Notes (Signed)
Pt found in bed without oxygen on, pt's oxygen saturation on room air 85%, O2  reapplied at 4L per [place] with saturations 90-91%, respiratory called for 0800 xopenex.  Lungs to auscultation very diminished with wheezes.  Will continue to monitor.

## 2012-11-02 NOTE — Progress Notes (Signed)
Patient evaluated for long-term disease management services with [facility] Care Management Program as a benefit of her [employer]. At bedside to speak with patient but she was sleeping soundly. She did not awake to name being called. Left [facility] Care Management packet at bedside and contact information with patient's sister. Patient will receive a post discharge transition of care call and will be evaluated for monthly home visits for assessments and for education.      Raiford Noble, MSN- Ed, RN,BSN, [facility], [phone]

## 2012-11-02 NOTE — Evaluation (Signed)
Occupational Therapy Evaluation Patient Details Name: Victoria Kennedy MRN: [id number] DOB: 07-20-1946 Today's Date: 11/02/2012 Time: 5784-6962 OT Time Calculation (min): 34 min  OT Assessment / Plan / Recommendation Clinical Impression  Pt. requires max cues to follow directions or to answer questions. Pt. may have much better performenace if more alert. Pt. required Max to Dep for ADL's secondary to lethargic. (Pt. very lethargic and required max cues to follow direction)    OT Assessment  Patient needs continued OT Services    Follow Up Recommendations  Home health OT    Barriers to Discharge None    Equipment Recommendations  3 in 1 bedside comode    Recommendations for Other Services    Frequency  Min 2X/week    Precautions / Restrictions Precautions Precautions: Fall Restrictions Weight Bearing Restrictions: No Other Position/Activity Restrictions: desaturates on O2   Pertinent Vitals/Pain     ADL  Eating/Feeding: Simulated;Moderate assistance Where Assessed - Eating/Feeding: Edge of bed Grooming: Performed;Wash/dry hands;Wash/dry face;Maximal assistance Where Assessed - Grooming: Supported sitting Upper Body Bathing: Simulated;Maximal assistance Where Assessed - Upper Body Bathing: Supported sitting Lower Body Bathing: Simulated;+1 Total assistance Where Assessed - Lower Body Bathing: Supported sitting Upper Body Dressing: Performed;Maximal assistance Where Assessed - Upper Body Dressing: Supine, head of bed up Lower Body Dressing: Performed;+1 Total assistance Where Assessed - Lower Body Dressing: Supine, head of bed up ADL Comments: Pt. very lethagic and transfers not attempted    OT Diagnosis: Generalized weakness;Cognitive deficits  OT Problem List: Decreased strength;Decreased activity tolerance;Impaired balance (sitting and/or standing);Decreased cognition;Decreased knowledge of use of DME or AE OT Treatment Interventions: Self-care/ADL training;DME and/or  AE instruction;Therapeutic activities   OT Goals Acute Rehab OT Goals OT Goal Formulation: With patient Time For Goal Achievement: 11/16/12 Potential to Achieve Goals: Good ADL Goals Pt Will Perform Grooming: with modified independence;Unsupported ADL Goal: Grooming - Progress: Goal set today Pt Will Perform Upper Body Dressing: with modified independence;Unsupported ADL Goal: Upper Body Dressing - Progress: Goal set today Pt Will Perform Lower Body Dressing: with modified independence;Unsupported ADL Goal: Lower Body Dressing - Progress: Goal set today Pt Will Transfer to Toilet: with modified independence;Comfort height toilet;3-in-1 ADL Goal: Toilet Transfer - Progress: Goal set today  Visit Information  Last OT Received On: 11/02/12 Assistance Needed: +1    Subjective Data  Subjective:  (Pt. agreeable to OT) Patient Stated Goal:  (go home)   Prior Functioning     Home Living Lives With: Son Available Help at Discharge: Family;Available 24 hours/day Type of Home: House Home Access: Stairs to enter [employer] of Steps: 3 Entrance Stairs-Rails: None Home Layout: One level Bathroom Shower/Tub: Engineer, manufacturing systems: Standard Home Adaptive Equipment: Other (comment) (walker-unsure what type; ) Additional Comments: Pt. sister stated that pt does not have 3-1 or shower seat Prior Function Level of Independence: Independent Comments: very lethargic and unable to attend to questions Communication Communication: No difficulties         Vision/Perception Vision - History Baseline Vision: Wears glasses all the time   Cognition  Cognition Overall Cognitive Status: Difficult to assess Difficult to assess due to: Level of arousal Arousal/Alertness: Lethargic Behavior During Session: Lethargic Cognition - Other Comments:  (unable to assess secondary pt. kept falling asleep.)    Extremity/Trunk Assessment Right Upper Extremity Assessment RUE  ROM/Strength/Tone: Unable to fully assess;Due to impaired cognition Left Upper Extremity Assessment LUE ROM/Strength/Tone: Unable to fully assess;Due to impaired cognition Right Lower Extremity Assessment RLE ROM/Strength/Tone: [facility] for tasks  assessed Left Lower Extremity Assessment LLE ROM/Strength/Tone: [facility] for tasks assessed Trunk Assessment Trunk Assessment: Normal     Mobility Bed Mobility Bed Mobility: Supine to Sit;Sit to Supine Supine to Sit: 2: Max assist Sitting - Scoot to [employer] of Bed: 4: Min guard Sit to Supine: 5: Supervision;HOB flat Details for Bed Mobility Assistance: pt lethargic and needing assist to initiate; slightly more alert on EOB, however still drifting off to sleep with SaO2 89-90% Transfers Sit to Stand: 4: Min guard;With upper extremity assist;From bed Stand to Sit: 4: Min guard;With upper extremity assist;To bed Details for Transfer Assistance: x3 to step up towards [facility]     Exercise     Balance Balance Balance Assessed: Yes Static Sitting Balance Static Sitting - Balance Support: Bilateral upper extremity supported Static Sitting - Level of Assistance: 4: Min assist Static Sitting - Comment/# of Minutes:  (jerky movements in sitting.) Static Standing Balance Static Standing - Balance Support: Left upper extremity supported Static Standing - Level of Assistance: 4: Min assist   End of Session OT - End of Session Activity Tolerance: Patient limited by fatigue Patient left: in bed  GO     Reco Shonk 11/02/2012, 12:18 PM

## 2012-11-03 LAB — CBC
Hemoglobin: 12 g/dL (ref 12.0–15.0)
MCHC: 31.7 g/dL (ref 30.0–36.0)
RBC: 3.84 MIL/uL — ABNORMAL LOW (ref 3.87–5.11)
WBC: 5.9 10*3/uL (ref 4.0–10.5)

## 2012-11-03 LAB — GLUCOSE, CAPILLARY: Glucose-Capillary: 102 mg/dL — ABNORMAL HIGH (ref 70–99)

## 2012-11-03 MED ORDER — LEVOFLOXACIN 750 MG PO TABS
750.0000 mg | ORAL_TABLET | Freq: Every day | ORAL | Status: DC
  Administered 2012-11-03 – 2012-11-06 (×4): 750 mg via ORAL
  Filled 2012-11-03 (×4): qty 1

## 2012-11-03 NOTE — Progress Notes (Signed)
Clinical Social Work Department CLINICAL SOCIAL WORK PLACEMENT NOTE 11/03/2012  Patient:  RAENETTE, SAKATA  Account Number:  [id number] Admit date:  10/28/2012  Clinical Social Worker:  Robin Searing  Date/time:  11/03/2012 04:17 PM  Clinical Social Work is seeking post-discharge placement for this patient at the following level of care:   SKILLED NURSING   (*CSW will update this form in Epic as items are completed)   11/03/2012  Patient/family provided with [facility] Department of Clinical Social Work's list of facilities offering this level of care within the geographic area requested by the patient (or if unable, by the patient's family).  11/03/2012  Patient/family informed of their freedom to choose among providers that offer the needed level of care, that participate in Medicare, Medicaid or managed care program needed by the patient, have an available bed and are willing to accept the patient.  11/03/2012  Patient/family informed of [facility]' ownership interest in [facility], as well as of the fact that they are under no obligation to receive care at this facility.  PASARR submitted to EDS on 11/03/2012 PASARR number received from EDS on   FL2 transmitted to all facilities in geographic area requested by pt/family on  11/03/2012 FL2 transmitted to all facilities within larger geographic area on   Patient informed that his/her managed care company has contracts with or will negotiate with  certain facilities, including the following:     Patient/family informed of bed offers received:   Patient chooses bed at  Physician recommends and patient chooses bed at    Patient to be transferred to  on   Patient to be transferred to facility by   The following physician request were entered in Epic:   Additional Comments: Reece Levy, MSW, Theresia Majors [phone]

## 2012-11-03 NOTE — Care Management Note (Signed)
    Page 1 of 2   11/06/2012     11:32:20 AM   CARE MANAGEMENT NOTE 11/06/2012  Patient:  Victoria Kennedy, Victoria Kennedy   Account Number:  [id number]  Date Initiated:  11/03/2012  Documentation initiated by:  GRAVES-BIGELOW,Kaliel Bolds  Subjective/Objective Assessment:   Pt admitted with cp and  elevated troponins.     Action/Plan:   CM will contine ot monitor for disposition needs. PT recommneds SNF for disposition and CSW will assist family with needs.   Anticipated DC Date:  11/05/2012   Anticipated DC Plan:  SKILLED NURSING FACILITY      DC Planning Services  CM consult      PAC Choice  DURABLE MEDICAL EQUIPMENT  HOME HEALTH   Choice offered to / List presented to:  C-1 Patient   DME arranged  [facility]      DME agency  [facility]     [facility] arranged  HH-1 RN  HH-10 DISEASE MANAGEMENT  HH-2 PT  HH-3 OT  HH-4 NURSE'S AIDE      HH agency  [facility]   Status of service:  Completed, signed off Medicare Important Message given?   (If response is "NO", the following Medicare IM given date fields will be blank) Date Medicare IM given:   Date Additional Medicare IM given:    Discharge Disposition:  HOME W HOME HEALTH SERVICES  Per UR Regulation:  Reviewed for med. necessity/level of care/duration of stay  If discussed at Long Length of Stay Meetings, dates discussed:    Comments:  11-06-12 1127 Tomi Bamberger, RN,BSN [phone] CM did make referral for services listed above with [facility]. SOC to begin within 24-48 hours post d/c. DME to be delivered to room before d/c. No further needs for CM at this time.

## 2012-11-03 NOTE — Progress Notes (Signed)
Subjective:  Patient denies any chest pain states her breathing is gradually improving but overall feels weak. Discussed with patient and family regarding skilled nursing facility, patient and family are in agreement  Objective:  Vital Signs in the last 24 hours: Temp:  [98.3 F (36.8 C)-98.4 F (36.9 C)] 98.3 F (36.8 C) (02/11 0440) Pulse Rate:  [88-96] 94 (02/11 0947) Resp:  [17-18] 18 (02/11 0440) BP: (104-118)/(31-75) 117/31 mmHg (02/11 0947) SpO2:  [90 %-97 %] 90 % (02/11 0932) Weight:  [52.572 kg (115 lb 14.4 oz)] 52.572 kg (115 lb 14.4 oz) (02/11 0440)  Intake/Output from previous day: 02/10 0701 - 02/11 0700 In: 1020 [P.O.:360; I.V.:360; IV Piggyback:300] Out: 200 [Urine:200] Intake/Output from this shift:    Physical Exam: Neck: no adenopathy, no carotid bruit, no JVD and supple, symmetrical, trachea midline Lungs: Decreased breath sound at bases with occasional right lung rhonchi air entry improved Heart: regular rate and rhythm, S1, S2 normal and Soft systolic murmur noted no S3 gallop Abdomen: soft, non-tender; bowel sounds normal; no masses,  no organomegaly Extremities: extremities normal, atraumatic, no cyanosis or edema  Lab Results:  Recent Labs  11/02/12 0615 11/03/12 0457  WBC 5.9 5.9  HGB 12.0 12.0  PLT 199 190    Recent Labs  11/01/12 0540  NA 137  K 4.4  CL 98  CO2 33*  GLUCOSE 101*  BUN 11  CREATININE 0.58   No results found for this basename: TROPONINI, CK, MB,  in the last 72 hours Hepatic Function Panel No results found for this basename: PROT, ALBUMIN, AST, ALT, ALKPHOS, BILITOT, BILIDIR, IBILI,  in the last 72 hours No results found for this basename: CHOL,  in the last 72 hours No results found for this basename: PROTIME,  in the last 72 hours  Imaging: Imaging results have been reviewed and No results found.  Cardiac Studies:  Assessment/Plan:  Status post probable very small non-Q-wave myocardial infarction status post  left cath  COPD with mild bronchiectasis improving  History of tobacco abuse  Osteopenia  Degenerative joint disease  Malnutrition Deconditioning Plan Continue present management Social service for skilled nursing facility Increase ambulation with assistance  LOS: 4 days    Victoria Kennedy N 11/03/2012, 10:50 AM

## 2012-11-03 NOTE — Progress Notes (Signed)
Physical Therapy Treatment Patient Details Name: Victoria Kennedy MRN: [id number] DOB: [birth year] Today's Date: 11/03/2012 Time: 4098-1191 PT Time Calculation (min): 23 min  PT Assessment / Plan / Recommendation Comments on Treatment Session  Pt. continues to be poorly aroused, did walk 5 ft, very ubsteady with legs tending to buckle while standing. noted jerking of arms, trunk and legs: like muscles give way. PT recommends STSNF, pt agreeable. If DC home pt will re3quire very close supervisiona nd assistance for all activity.    Follow Up Recommendations  SNF;Supervision/Assistance - 24 hour     Does the patient have the potential to tolerate intense rehabilitation     Barriers to Discharge        Equipment Recommendations  Other (comment)    Recommendations for Other Services    Frequency Min 3X/week   Plan Discharge plan needs to be updated;Frequency remains appropriate    Precautions / Restrictions Precautions Precautions: Fall Precaution Comments: while standing, knees will buckle Restrictions Weight Bearing Restrictions: No   Pertinent Vitals/Pain sats 89% 3l     Mobility  Bed Mobility Supine to Sit: 4: Min guard Sitting - Scoot to Edge of Bed: 4: Min guard Sit to Supine: 4: Min guard Transfers Sit to Stand: 3: Mod assist;With upper extremity assist;From bed;From chair/3-in-1;With armrests Stand to Sit: To bed;To chair/3-in-1;4: Min guard Ambulation/Gait Ambulation/Gait Assistance: 3: Mod assist Ambulation Distance (Feet): 5 Feet (x 2) Assistive device: Rolling walker Ambulation/Gait Assistance Details: pt's arms, trunk and legs jerking while sitting. standing. Knees w noted to buckle several times while standing. support given. Gait Pattern: Step-to pattern;Trunk flexed    Exercises     PT Diagnosis:    PT Problem List:   PT Treatment Interventions:     PT Goals Acute Rehab PT Goals Pt will go Supine/Side to Sit: with modified independence PT Goal:  Supine/Side to Sit - Progress: Progressing toward goal Pt will Sit at [facility] of Bed: with supervision;1-2 min;with no upper extremity support PT Goal: Sit at Edge Of Bed - Progress: Progressing toward goal Pt will go Sit to Supine/Side: with modified independence;with HOB 0 degrees PT Goal: Sit to Supine/Side - Progress: Progressing toward goal Pt will go Sit to Stand: with supervision PT Goal: Sit to Stand - Progress: Progressing toward goal Pt will Ambulate: 51 - 150 feet;with supervision;with least restrictive assistive device;Other (comment) PT Goal: Ambulate - Progress: Progressing toward goal  Visit Information  Last PT Received On: 11/03/12 Assistance Needed: +2 (to try to walk any distance950)    Subjective Data  Subjective: It will take an hour. I would talk to someone about rehab   Cognition  Cognition Overall Cognitive Status: Difficult to assess Difficult to assess due to: Hard of hearing/deaf Arousal/Alertness: Lethargic Behavior During Session: Lethargic Cognition - Other Comments: pt was alert enough to participate in mobility.    Balance  Static Sitting Balance Static Sitting - Balance Support: Bilateral upper extremity supported Static Sitting - Level of Assistance: 4: Min assist;5: Stand by assistance Static Sitting - Comment/# of Minutes: varied by arousal, tends to lean forward while sitting  Static Standing Balance Static Standing - Balance Support: Bilateral upper extremity supported Static Standing - Level of Assistance: 3: Mod assist;Other (comment)  End of Session PT - End of Session Equipment Utilized During Treatment: Oxygen (3 l) Activity Tolerance: Patient limited by fatigue Patient left: in bed;with call bell/phone within reach;with bed alarm set;with family/visitor present Nurse Communication: Mobility status;Other (comment) (+rec. SNF)  GP     Rada Hay 11/03/2012, 10:37 AM [phone]

## 2012-11-03 NOTE — Progress Notes (Signed)
Clinical Social Work Department BRIEF PSYCHOSOCIAL ASSESSMENT 11/03/2012  Patient:  Victoria Kennedy, Victoria Kennedy     Account Number:  [id number]     Admit date:  10/28/2012  Clinical Social Worker:  Robin Searing  Date/Time:  11/03/2012 04:07 PM  Referred by:  Physician  Date Referred:  11/03/2012 Referred for  SNF Placement   Other Referral:   Interview type:  Other - See comment Other interview type:   Met with patient and her sister Victoria Kennedy at bedside    PSYCHOSOCIAL DATA Living Status:  FAMILY Admitted from facility:   Level of care:   Primary support name:  sister- Darel Hong, son- Loistine Chance Primary support relationship to patient:  FAMILY Degree of support available:   good/ no 24hr supervision/assist available    CURRENT CONCERNS Current Concerns  Post-Acute Placement   Other Concerns:    SOCIAL WORK ASSESSMENT / PLAN Met with patient and her sister at bedside- per patient, her son lives with her but is not able to provide the care that she will need at home. She currently is very weak and unsteady and will benefit from SNF- patient is agreeable to SNF- discussed process and coverage- encouraged sister to look into Medicaid as an option for backup to her Medicare.   Assessment/plan status:  Other - See comment Other assessment/ plan:   Will complete FL2 and Pasarr for SNF search   Information/referral to community resources:   SNF  DSS/Medicaid    PATIENT'S/FAMILY'S RESPONSE TO PLAN OF CARE: Patient agreeable to SNF at d.c.  Sister is very supportive and attentive to helping her sister and is also on board with this plan- she plans to update patient's son to above. Will proceed with SNF process and advise as bed offers are rec'd/      Reece Levy, MSW, Theresia Majors [phone]

## 2012-11-03 NOTE — Progress Notes (Signed)
SATURATION QUALIFICATIONS: (This note is used to comply with regulatory documentation for home oxygen)  Patient Saturations on Room Air at Rest = 89%  Patient Saturations on Room Air while Ambulating = 85%  Patient Saturations on 3 Liters of oxygen while Ambulating = 92%

## 2012-11-04 LAB — GLUCOSE, CAPILLARY
Glucose-Capillary: 94 mg/dL (ref 70–99)
Glucose-Capillary: 89 mg/dL (ref 70–99)

## 2012-11-04 LAB — CBC
HCT: 37 % (ref 36.0–46.0)
MCHC: 31.6 g/dL (ref 30.0–36.0)
RDW: 13.5 % (ref 11.5–15.5)

## 2012-11-04 MED ORDER — DOCUSATE SODIUM 100 MG PO CAPS
100.0000 mg | ORAL_CAPSULE | Freq: Two times a day (BID) | ORAL | Status: DC | PRN
  Administered 2012-11-05: 100 mg via ORAL
  Filled 2012-11-04: qty 1

## 2012-11-04 NOTE — Progress Notes (Signed)
Occupational Therapy Treatment Patient Details Name: Victoria Kennedy MRN: [id number] DOB: Mar 27, 1946 Today's Date: 11/04/2012 Time: 4782-9562 OT Time Calculation (min): 32 min  OT Assessment / Plan / Recommendation Comments on Treatment Session Pt much more alert with improvement in mobility and ability to perform grooming, dressing, and toileting tasks.  Endurance remains decreased.    Follow Up Recommendations  SNF    Barriers to Discharge       Equipment Recommendations  None recommended by OT    Recommendations for Other Services    Frequency Min 2X/week   Plan Discharge plan remains appropriate    Precautions / Restrictions Precautions Precautions: Fall   Pertinent Vitals/Pain No pain.    ADL  Grooming: Wash/dry hands;Wash/dry face;[employer] care;Set up Where Assessed - Grooming: Unsupported sitting Upper Body Dressing: Set up Where Assessed - Upper Body Dressing: Unsupported sitting Toilet Transfer: Min guard Toilet Transfer Method: Sit to Barista: Materials engineer and Hygiene: Supervision/safety Where Assessed - Engineer, mining and Hygiene: Sit to stand from 3-in-1 or toilet Transfers/Ambulation Related to ADLs: min guard assist for transfer and standing briefly for ADL ADL Comments: Pt alert and participating.  No buckling of knees.    OT Diagnosis:    OT Problem List:   OT Treatment Interventions:     OT Goals ADL Goals Pt Will Perform Grooming: with modified independence;Unsupported ADL Goal: Grooming - Progress: Progressing toward goals Pt Will Perform Upper Body Dressing: with modified independence;Unsupported ADL Goal: Upper Body Dressing - Progress: Progressing toward goals Pt Will Perform Lower Body Dressing: with modified independence;Unsupported Pt Will Transfer to Toilet: with modified independence;Comfort height toilet;3-in-1 ADL Goal: Toilet Transfer - Progress: Progressing  toward goals  Visit Information  Last OT Received On: 11/04/12 Assistance Needed: +1    Subjective Data      Prior Functioning       Cognition  Cognition Overall Cognitive Status: Impaired Area of Impairment: Attention;Safety/judgement Arousal/Alertness: Awake/alert Orientation Level: Appears intact for tasks assessed Behavior During Session: [facility] for tasks performed Current Attention Level: Selective Safety/Judgement: Decreased awareness of safety precautions Safety/Judgement - Other Comments: fall risk    Mobility  Bed Mobility Bed Mobility: Supine to Sit;Sit to Supine Supine to Sit: 5: Supervision;With rails;HOB elevated Sitting - Scoot to Edge of Bed: 5: Supervision;With rail Sit to Supine: 5: Supervision;With rail;HOB elevated Transfers Transfers: Sit to Stand;Stand to Sit Sit to Stand: 4: Min guard;With upper extremity assist;From bed;From chair/3-in-1 Stand to Sit: 5: Supervision;With upper extremity assist;To chair/3-in-1;To bed    Exercises      Balance Balance Balance Assessed: Yes Static Sitting Balance Static Sitting - Balance Support: Feet supported Static Sitting - Level of Assistance: 7: Independent Static Sitting - Comment/# of Minutes: 5 Static Standing Balance Static Standing - Balance Support: No upper extremity supported Static Standing - Level of Assistance: 5: Stand by assistance   End of Session OT - End of Session Activity Tolerance: Patient limited by fatigue Patient left: with bed alarm set;with call bell/phone within reach;in bed  GO     Evern Bio 11/04/2012, 1:58 PM [phone]

## 2012-11-04 NOTE — Progress Notes (Signed)
SNF bed offers provided to patient and family- they have selected the bed at [facility]- paper work being done today for anticipated d/c tomorrow- Reece Levy, MSW, Theresia Majors [phone]

## 2012-11-04 NOTE — Progress Notes (Signed)
Subjective Patient denies any chest pain since breathing is slowly improving. Complaints of constipation. Appetite poor feels weak Objective:  Vital Signs in the last 24 hours: Temp:  [98.1 F (36.7 C)-98.6 F (37 C)] 98.6 F (37 C) (02/12 0500) Pulse Rate:  [86-95] 86 (02/12 0956) Resp:  [16-18] 18 (02/12 0500) BP: (97-128)/(54-71) 104/60 mmHg (02/12 0956) SpO2:  [91 %-97 %] 97 % (02/12 0500)  Intake/Output from previous day: 02/11 0701 - 02/12 0700 In: 120 [P.O.:120] Out: -  Intake/Output from this shift: Total I/O In: 120 [P.O.:120] Out: -   Physical Exam: Neck: no adenopathy, no carotid bruit, no JVD and supple, symmetrical, trachea midline Lungs: Decreased breath sound at bases Heart: regular rate and rhythm, S1, S2 normal and Soft systolic murmur noted no S3  Abdomen: soft, non-tender; bowel sounds normal; no masses,  no organomegaly Extremities: extremities normal, atraumatic, no cyanosis or edema  Lab Results:  Recent Labs  11/03/12 0457 11/04/12 0445  WBC 5.9 6.8  HGB 12.0 11.7*  PLT 190 203   No results found for this basename: NA, K, CL, CO2, GLUCOSE, BUN, CREATININE,  in the last 72 hours No results found for this basename: TROPONINI, CK, MB,  in the last 72 hours Hepatic Function Panel No results found for this basename: PROT, ALBUMIN, AST, ALT, ALKPHOS, BILITOT, BILIDIR, IBILI,  in the last 72 hours No results found for this basename: CHOL,  in the last 72 hours No results found for this basename: PROTIME,  in the last 72 hours  Imaging: Imaging results have been reviewed and No results found.  Cardiac Studies:  Assessment/Plan:  Status post probable very small non-Q-wave myocardial infarction status post left cath  COPD with mild bronchiectasis improving  History of tobacco abuse  Osteopenia  Degenerative joint disease  Malnutrition  Deconditioning Plan Rx for constipation Increase ambulation with the assistance Possible discharge to  skilled nursing facility tomorrow  LOS: 5 days    Victoria Kennedy N 11/04/2012, 12:50 PM

## 2012-11-05 LAB — CBC
Platelets: 209 10*3/uL (ref 150–400)
RBC: 3.99 MIL/uL (ref 3.87–5.11)
WBC: 6.6 10*3/uL (ref 4.0–10.5)

## 2012-11-05 LAB — GLUCOSE, CAPILLARY
Glucose-Capillary: 81 mg/dL (ref 70–99)
Glucose-Capillary: 92 mg/dL (ref 70–99)
Glucose-Capillary: 92 mg/dL (ref 70–99)

## 2012-11-05 NOTE — Progress Notes (Signed)
Subjective:    denies any chest pain or shortness of breath. Patient and family he has not decided regarding student nursing facility due to financial issues Objective:  Vital Signs in the last 24 hours: Temp:  [97.3 F (36.3 C)-97.8 F (36.6 C)] 97.5 F (36.4 C) (02/13 0615) Pulse Rate:  [86-93] 87 (02/13 0944) Resp:  [18-20] 18 (02/13 0615) BP: (112-132)/(70-87) 112/70 mmHg (02/13 0944) SpO2:  [91 %-100 %] 91 % (02/13 0916)  Intake/Output from previous day: 02/12 0701 - 02/13 0700 In: 120 [P.O.:120] Out: -  Intake/Output from this shift:    Physical Exam: Neck: no adenopathy, no carotid bruit, no JVD and supple, symmetrical, trachea midline Lungs: Decreased breath sounds at bases Heart: regular rate and rhythm, S1, S2 normal, no murmur, click, rub or gallop Abdomen: soft, non-tender; bowel sounds normal; no masses,  no organomegaly Extremities: extremities normal, atraumatic, no cyanosis or edema  Lab Results:  Recent Labs  11/04/12 0445 11/05/12 0517  WBC 6.8 6.6  HGB 11.7* 12.3  PLT 203 209   No results found for this basename: NA, K, CL, CO2, GLUCOSE, BUN, CREATININE,  in the last 72 hours No results found for this basename: TROPONINI, CK, MB,  in the last 72 hours Hepatic Function Panel No results found for this basename: PROT, ALBUMIN, AST, ALT, ALKPHOS, BILITOT, BILIDIR, IBILI,  in the last 72 hours No results found for this basename: CHOL,  in the last 72 hours No results found for this basename: PROTIME,  in the last 72 hours  Imaging: Imaging results have been reviewed and No results found.  Cardiac Studies:  Assessment/Plan:  Status post probable very small non-Q-wave myocardial infarction status post left cath  COPD with mild bronchiectasis improving  History of tobacco abuse  Osteopenia  Degenerative joint disease  Malnutrition  Deconditioning Plan Social service for discharge planning discussed with her sister regarding student nursing facility  did want  to talk it over before making final decision  LOS: 6 days    Victoria Kennedy N 11/05/2012, 10:59 AM

## 2012-11-05 NOTE — Progress Notes (Signed)
Patient is now declining SNF- has copay that she cannot afford- she now wants to consider home with [facility] and DME- will advise RNCM-  Reece Levy, MSW, Theresia Majors [phone]

## 2012-11-06 LAB — CBC
MCH: 30.3 pg (ref 26.0–34.0)
MCV: 93.8 fL (ref 78.0–100.0)
Platelets: 192 10*3/uL (ref 150–400)
RDW: 13.5 % (ref 11.5–15.5)
WBC: 6.2 10*3/uL (ref 4.0–10.5)

## 2012-11-06 LAB — GLUCOSE, CAPILLARY

## 2012-11-06 MED ORDER — LEVOFLOXACIN 750 MG PO TABS: 750.0000 mg | ORAL_TABLET | Freq: Every day | ORAL | Status: DC

## 2012-11-06 MED ORDER — NITROGLYCERIN 0.4 MG SL SUBL: 0.4000 mg | SUBLINGUAL_TABLET | SUBLINGUAL | Status: DC | PRN

## 2012-11-06 MED ORDER — ATORVASTATIN CALCIUM 20 MG PO TABS: 20.0000 mg | ORAL_TABLET | Freq: Every day | ORAL | Status: DC

## 2012-11-06 MED ORDER — METOPROLOL SUCCINATE ER 25 MG PO TB24: 12.5000 mg | ORAL_TABLET | Freq: Every day | ORAL | Status: DC

## 2012-11-06 MED ORDER — ENSURE PUDDING PO PUDG: 1.0000 | Freq: Three times a day (TID) | ORAL | Status: DC

## 2012-11-06 MED ORDER — BUDESONIDE-FORMOTEROL FUMARATE 160-4.5 MCG/ACT IN AERO: 2.0000 | INHALATION_SPRAY | Freq: Two times a day (BID) | RESPIRATORY_TRACT | Status: DC

## 2012-11-06 NOTE — Discharge Summary (Signed)
Victoria Kennedy, Victoria Kennedy               ACCOUNT NO.:  [id number]  MEDICAL RECORD NO.:  [id number]  LOCATION:  [id number]                        FACILITY:  [facility]  PHYSICIAN:  Mirtie Bastyr N. Victoria Kennedy, M.D. DATE OF BIRTH:  1946/07/19  DATE OF ADMISSION:  10/30/2012 DATE OF DISCHARGE:  11/06/2012                              DISCHARGE SUMMARY   ADMITTING DIAGNOSES: 1. Acute coronary syndrome. 2. Bronchitis rule out pneumonia. 3. Chronic obstructive pulmonary disease. 4. Tobacco abuse. 5. Positive family history of coronary artery disease. 6. Osteopenia. 7. Degenerative joint disease.  FINAL DIAGNOSES: 1. Status post probable very small non-Q-wave myocardial infarction,     status post left catheterization. 2. Chronic obstructive pulmonary disease with mild bronchiectasis. 3. History of tobacco abuse. 4. Osteopenia. 5. Degenerative joint disease. 6. Malnutrition. 7. Deconditioning.  DISCHARGE HOME MEDICATIONS:  Lovastatin 20 mg 1 tablet daily, Symbicort 160/4.5 mcg 2 puffs twice daily, feeding supplement 1 container 3 times daily, levofloxacin 750 mg 1 tablet daily by mouth for 5 days, metoprolol succinate 25 mg half tablet daily, Nitrostat 0.4 mg sublingual use as directed, aspirin 81 mg 1 tablet daily, and O2 via nasal cannula at 2 L/minute.  DIET:  Low salt, low cholesterol, avoid sweets, post cardiac cath instructions have been given.  Follow up with me in 1 week.  CONDITION AT DISCHARGE:  Stable.  BRIEF HISTORY AND HOSPITAL COURSE:  Victoria Kennedy is a 67 year old female with past medical history significant for COPD, tobacco abuse, positive family history of coronary artery disease.  She came to the ER complaining of retrosternal chest pressure radiating across the chest and neck and right shoulder associated with mild shortness of breath and nausea, which woke her up around 1:00 a.m.  Did not seek any medical attention until this morning.  The patient also complains of cough  with yellowish phlegm off and on for last 1 week associated with fever.  EKG done in the ER showed normal sinus rhythm with poor R-wave progression in V1 to V3 with ST-T wave changes and was noted to have mildly elevated troponin I.  The patient denies any such episodes of chest pain in the past.  Denies palpitation, lightheadedness, or syncope.  PAST MEDICAL HISTORY:  As above.  PHYSICAL EXAMINATION:  GENERAL:  She was alert, awake, oriented x3. VITAL SIGNS:  Blood pressure was 115/70, pulse was 97.  She was afebrile.  Conjunctivae was pink. NECK:  Supple.  No JVD.  No bruit. LUNGS:  She has decreased breath sounds at bases. CARDIOVASCULAR:  S1 and S2 was normal.  There was soft systolic murmur. No S3, gallop. ABDOMEN:  Soft.  Bowel sounds were present.  Nontender. EXTREMITIES:  There is no clubbing, cyanosis, or edema.  LABORATORY DATA:  Sodium was 132, potassium 4.0, BUN 25, creatinine 1.02.  Glucose was slightly elevated at 136, troponin I first set in the ER was 0.63, hemoglobin was 15.7, hematocrit 46.3, white count of 8.0. Repeat troponin-I was 0.36, which is trending down, then 0.39, next set was less than 0.30.  Her cholesterol was 121, LDL was 83, HDL 20, triglycerides were 91.  Blood sugars, repeat fasting sugar of 02/09 was 101.  Her D-dimers were  elevated in the ER which was 5.43.  Subsequently, she had spiral CT of the chest which was negative for pulmonary embolism.  TSH was 0.55.  Repeat EKG showed normal sinus rhythm with poor R-wave progression, with normalization of ST-T wave changes in the inferior leads.  BRIEF HOSPITAL COURSE:  The patient was admitted to step-down unit.  The patient underwent left cardiac cath with selective left and right coronary angiography as per procedure report.  The patient tolerated the procedure well.  Post procedure, the patient did not have any episodes of anginal chest pain.  The patient was started on  broad-spectrum antibiotics, which were switched to p.o. Levaquin.  The patient initially decided to go to skilled nursing facility, but finally then decided due to financial issues wants to go home.  The patient will stay with her son at home.  We will arrange for OT/PT, home health aide, and RN as outpatient.  The patient also will be on home oxygen 2 L/minute via nasal cannula, which we will arrange.  Her sats on room air was 85%.  We will arrange for home O2 at 2 L via nasal cannula.     Victoria Kennedy. Victoria Kennedy, M.D.     [facility]  D:  11/06/2012  T:  11/06/2012  Job:  [id number]

## 2012-11-06 NOTE — ED Provider Notes (Signed)
SATURATION QUALIFICATIONS: (This note is used to comply with regulatory documentation for home oxygen)  Patient Saturations on Room Air at Rest = 89%  Patient Saturations on Room Air while Ambulating = 71%  Patient Saturations on 2 Liters of oxygen while Ambulating = 91%  Please briefly explain why patient needs home oxygen: Desats without oxygen

## 2012-11-06 NOTE — Progress Notes (Signed)
Patient is going home and no longer wants to pursue SNF- HH and DME arranged per [facility]. Reece Levy, MSW, Theresia Majors [phone]

## 2012-11-06 NOTE — Discharge Summary (Signed)
  Discharge summary dictated on 11/06/2012 dictation number is [id number]

## 2012-11-06 NOTE — Progress Notes (Signed)
PT Cancellation Note  Patient Details Name: Victoria Kennedy MRN: [id number] DOB: July 25, 1946   Cancelled Treatment:    Reason Eval/Treat Not Completed: Other (comment); RN reports just walked patient in hallway to check O2 sats.  Spoke with patient and wants to rest prior to walking again.  Noted changed d/c plan due to copay at facility.  RN reports patient steady with walker and patient reports son available to help at home.  Will check back as time allows unless patient discharged first.     [facility] 11/06/2012, 1:20 PM

## 2012-11-30 ENCOUNTER — Emergency Department (HOSPITAL_COMMUNITY): Payer: Medicare Other

## 2012-11-30 ENCOUNTER — Encounter (HOSPITAL_COMMUNITY): Payer: Self-pay | Admitting: Emergency Medicine

## 2012-11-30 ENCOUNTER — Emergency Department (HOSPITAL_COMMUNITY)
Admission: EM | Admit: 2012-11-30 | Discharge: 2012-11-30 | Disposition: A | Discharge status: Home / Self Care | Payer: Medicare Other | Attending: Emergency Medicine | Admitting: Emergency Medicine

## 2012-11-30 DIAGNOSIS — Z7982 Long term (current) use of aspirin: Secondary | ICD-10-CM | POA: Insufficient documentation

## 2012-11-30 DIAGNOSIS — Z859 Personal history of malignant neoplasm, unspecified: Secondary | ICD-10-CM | POA: Insufficient documentation

## 2012-11-30 DIAGNOSIS — R1084 Generalized abdominal pain: Secondary | ICD-10-CM | POA: Insufficient documentation

## 2012-11-30 DIAGNOSIS — J4489 Other specified chronic obstructive pulmonary disease: Secondary | ICD-10-CM | POA: Insufficient documentation

## 2012-11-30 DIAGNOSIS — J449 Chronic obstructive pulmonary disease, unspecified: Secondary | ICD-10-CM | POA: Insufficient documentation

## 2012-11-30 DIAGNOSIS — F17200 Nicotine dependence, unspecified, uncomplicated: Secondary | ICD-10-CM | POA: Insufficient documentation

## 2012-11-30 DIAGNOSIS — Z79899 Other long term (current) drug therapy: Secondary | ICD-10-CM | POA: Insufficient documentation

## 2012-11-30 DIAGNOSIS — K5900 Constipation, unspecified: Secondary | ICD-10-CM

## 2012-11-30 MED ORDER — LIDOCAINE HCL 2 % EX GEL
Freq: Once | CUTANEOUS | Status: AC
  Administered 2012-11-30: 23:00:00 via TOPICAL

## 2012-11-30 NOTE — ED Notes (Signed)
Per EMS: pt from home c/o constipation x 4-5 days; pt sts rectal pain and lower abd pain

## 2012-11-30 NOTE — ED Notes (Signed)
Spoke with MD.  Pt has been disimpacted.  Will monitor for bowel movement.

## 2012-11-30 NOTE — ED Notes (Addendum)
Pt requesting to leave AMA, sts "He did a procedure on me. I've went to the bathroom and did my thing.I feel better and want to leave."; informed Dr.Yao that patient wants to leave prior to d/c. MD speaking with patient with plan to discharge home

## 2012-11-30 NOTE — ED Provider Notes (Signed)
History     CSN: [id number]  Arrival date & time 11/30/12  1753   First MD Initiated Contact with Patient 11/30/12 2301      Chief Complaint  Patient presents with  . Constipation    (Consider location/radiation/quality/duration/timing/severity/associated sxs/prior treatment) The history is provided by the patient.  Victoria Kennedy is a 67 y.o. female history of COPD here presenting with constipation. Constipation for the last 5 days. She's been eating and drinking well. She tried to drink some juice but did not improve. She has hard stool when she tries to have bowel movement. Constipation worse to the point that she urinates less but has no dysuria. No fever or nausea or vomiting. Hx of D&C in the past. No hx SBO. Bought miralax today but didn't use it. Didn't use colace or senna.    Past Medical History  Diagnosis Date  . COPD (chronic obstructive pulmonary disease)   . Cancer     Past Surgical History  Procedure Laterality Date  . I&d extremity  10/22/2011    Procedure: IRRIGATION AND DEBRIDEMENT EXTREMITY;  Surgeon: Karen Chafe, MD;  Location: [facility] OR;  Service: Orthopedics;  Laterality: Left;  I&D Left Thumb with Skin Flap    History reviewed. No pertinent family history.  History  Substance Use Topics  . Smoking status: Current Every Day Smoker  . Smokeless tobacco: Not on file  . Alcohol Use: No    OB History   Grav Para Term Preterm Abortions TAB SAB Ect Mult Living                  Review of Systems  Gastrointestinal: Positive for constipation.  All other systems reviewed and are negative.    Allergies  Benadryl and Tylenol  Home Medications   Current Outpatient Rx  Name  Route  Sig  Dispense  Refill  . aspirin EC 81 MG tablet   Oral   Take 81 mg by mouth daily.         Marland Kitchen atorvastatin (LIPITOR) 20 MG tablet   Oral   Take 20 mg by mouth daily at 6 PM.         . budesonide-formoterol (SYMBICORT) 160-4.5 MCG/ACT inhaler    Inhalation   Inhale 2 puffs into the lungs 2 (two) times daily.         Marland Kitchen docusate sodium (COLACE) 100 MG capsule   Oral   Take 100 mg by mouth 2 (two) times daily as needed for constipation.         . metoprolol succinate (TOPROL-XL) 25 MG 24 hr tablet   Oral   Take 12.5 mg by mouth daily.         . nitroGLYCERIN (NITROSTAT) 0.4 MG SL tablet   Sublingual   Place 0.4 mg under the tongue every 5 (five) minutes x 3 doses as needed for chest pain.           BP 155/77  Pulse 91  Temp(Src) 97.8 F (36.6 C) (Oral)  Resp 16  SpO2 96%  Physical Exam  Nursing note and vitals reviewed. Constitutional: She is oriented to person, place, and time. She appears well-nourished.  Uncomfortable   HENT:  Head: Normocephalic.  Mouth/Throat: Oropharynx is clear and moist.  Eyes: Conjunctivae are normal. Pupils are equal, round, and reactive to light.  Neck: Normal range of motion. Neck supple.  Cardiovascular: Normal rate, regular rhythm and normal heart sounds.   Pulmonary/Chest: Effort normal and breath sounds  normal. No respiratory distress. She has no wheezes. She has no rales.  Abdominal: Soft. Bowel sounds are normal.  + diffuse lower abdominal tenderness, worse LLQ. No rebound. Rectal- hard stool, no hemorrhoids.   Musculoskeletal: Normal range of motion. She exhibits no edema and no tenderness.  Neurological: She is alert and oriented to person, place, and time.  Skin: Skin is warm and dry.  Psychiatric: She has a normal mood and affect. Her behavior is normal. Judgment and thought content normal.    ED Course  Procedures (including critical care time)  Disimpaction I used 2% lido for numbing the rectal area. Digital disimpaction performed, no complications.   Labs Reviewed - No data to display Dg Abd Acute W/chest  11/30/2012  *RADIOLOGY REPORT*  Clinical Data: Abdominal pain, constipation for week, COPD, smoker  ACUTE ABDOMEN SERIES (ABDOMEN 2 VIEW & CHEST 1 VIEW)   Comparison: Chest radiograph 10/30/2012  Findings: Normal heart size, mediastinal contours, and pulmonary vascularity. Atherosclerotic calcification aorta. Emphysematous changes consistent with COPD. No acute infiltrate, pleural effusion, or pneumothorax. Decreased interstitial prominence versus previous exam. Old fracture posterolateral left sixth rib.  Density in pelvis may represent a distended urinary bladder. Scattered stool throughout colon. No bowel dilatation, bowel wall thickening or free intraperitoneal air. Linear calcifications project over the upper abdomen bilaterally, both over and external to the renal silhouettes, without definite urinary tract calcification.  IMPRESSION: COPD changes. No acute abdominal findings.   Original Report Authenticated By: Ulyses Southward, M.D.      No diagnosis found.    MDM  Victoria Kennedy is a 67 y.o. female here with constipation. Xray showed no SBO. After digital disimpaction, she felt better. Able to have a bowel movement afterwards. Abdomen soft and nontender afterwards. I told her to take miralax daily and eat high fiber diet.         Richardean Canal, MD 11/30/12 [phone]

## 2012-12-02 ENCOUNTER — Ambulatory Visit (INDEPENDENT_AMBULATORY_CARE_PROVIDER_SITE_OTHER): Payer: Medicare Other | Admitting: Internal Medicine

## 2012-12-02 ENCOUNTER — Encounter: Payer: Self-pay | Admitting: Internal Medicine

## 2012-12-02 VITALS — BP 124/86 | HR 87 | Temp 98.5°F | Ht 60.0 in | Wt 103.0 lb

## 2012-12-02 DIAGNOSIS — J449 Chronic obstructive pulmonary disease, unspecified: Secondary | ICD-10-CM

## 2012-12-02 DIAGNOSIS — R05 Cough: Secondary | ICD-10-CM

## 2012-12-02 DIAGNOSIS — R059 Cough, unspecified: Secondary | ICD-10-CM

## 2012-12-02 DIAGNOSIS — F17200 Nicotine dependence, unspecified, uncomplicated: Secondary | ICD-10-CM

## 2012-12-02 NOTE — Progress Notes (Signed)
Subjective:    Patient ID: Victoria Kennedy, female    DOB: 03/01/1946  MRN: [id number]  HPI   64  yowf  With typical smoker's cough resolved after quit smoking 10/30/12 with heart attack and started on 02 and referred by [facility] for pulmonary evaluation.  DATE OF ADMISSION: 10/30/2012  DATE OF DISCHARGE: 11/06/2012  DISCHARGE SUMMARY  ADMITTING DIAGNOSES:  1. Acute coronary syndrome.  2. Bronchitis rule out pneumonia.  3. Chronic obstructive pulmonary disease.  4. Tobacco abuse.  5. Positive family history of coronary artery disease.  6. Osteopenia.  7. Degenerative joint disease.  FINAL DIAGNOSES:  1. Status post probable very small non-Q-wave myocardial infarction,  status post left catheterization.  2. Chronic obstructive pulmonary disease with mild bronchiectasis.  3. History of tobacco abuse.  4. Osteopenia.  5. Degenerative joint disease.  6. Malnutrition.  7. Deconditioning.  DISCHARGE HOME MEDICATIONS: Lovastatin 20 mg 1 tablet daily, Symbicort  160/4.5 mcg 2 puffs twice daily, feeding supplement 1 container 3 times  daily, levofloxacin 750 mg 1 tablet daily by mouth for 5 days,  metoprolol succinate 25 mg half tablet daily, Nitrostat 0.4 mg  sublingual use as directed, aspirin 81 mg 1 tablet daily, and O2 via  nasal cannula at 2 L/minute.   12/02/2012 1st pulmonary cc new pattern mostly dry cough and sense of post nasal drip comes and goes since d/c from hosp and started symbicort 160 2bid  And 02 at home at discharge. Limited by leg pains R not by breathing. Finished levaquin for productive cough one week prior to above admit and says this resolved.  No obvious daytime variabilty or assoc excess mucus production or cp or chest tightness, subjective wheeze overt sinus or hb symptoms. No unusual exp hx or h/o childhood pna/ asthma or premature birth to his knowledge.   Sleeping ok without nocturnal  or early am exacerbation  of respiratory  c/o's or need for noct saba.  Also denies any obvious fluctuation of symptoms with weather or environmental changes or other aggravating or alleviating factors except as outlined above    Review of Systems  Constitutional: Negative for fever, chills and unexpected weight change.  HENT: Positive for postnasal drip. Negative for ear pain, nosebleeds, congestion, sore throat, rhinorrhea, sneezing, trouble swallowing, dental problem, voice change and sinus pressure.   Eyes: Negative for visual disturbance.  Respiratory: Positive for cough. Negative for choking and shortness of breath.   Cardiovascular: Positive for chest pain. Negative for leg swelling.  Gastrointestinal: Negative for vomiting, abdominal pain and diarrhea.  Genitourinary: Negative for difficulty urinating.  Musculoskeletal: Negative for arthralgias.  Skin: Negative for rash.  Neurological: Negative for tremors, syncope and headaches.  Hematological: Does not bruise/bleed easily.       Objective:   Physical Exam  Thin amb wf unusual affect wo failed to answer a single question asked in a straightforward manner, tending to go off on tangents or answer questions with ambiguous medical terms or diagnoses    HEENT mild turbinate edema.  Oropharynx no thrush or excess pnd or cobblestoning.  No JVD or cervical adenopathy. Mild accessory muscle hypertrophy. Trachea midline, nl thryroid. Chest was hyperinflated by percussion with diminished breath sounds and moderate increased exp time without wheeze. Hoover sign positive at mid inspiration. Regular rate and rhythm without murmur gallop or rub or increase P2 or edema.  Abd: no hsm, nl excursion. Ext warm without cyanosis or clubbing.   CT  10/30/12  Chronic lung disease with  mild bronchiectasis in the right upper  lobe. No evidence of acute pulmonary embolism or focal pulmonary  consolidation      Assessment & Plan:

## 2012-12-02 NOTE — Patient Instructions (Addendum)
Change symbicort 160 2 every 12 hours if needed for your breathing but if your breathing is not limiting you from desired activities then you don't need it  Work on inhaler technique:  relax and gently blow all the way out then take a nice smooth deep breath back in, triggering the inhaler at same time you start breathing in.  Hold for up to 5 seconds if you can.  Rinse and gargle with water when done   If your mouth or throat starts to bother you,   I suggest you time the inhaler to your dental care and after using the inhaler(s) brush teeth and tongue with a baking soda containing toothpaste and when you rinse this out, gargle with it first to see if this helps your mouth and throat.     Please schedule a follow up office visit in 6 weeks, call sooner if needed with pfts on return

## 2012-12-05 DIAGNOSIS — R059 Cough, unspecified: Secondary | ICD-10-CM | POA: Insufficient documentation

## 2012-12-05 DIAGNOSIS — J449 Chronic obstructive pulmonary disease, unspecified: Secondary | ICD-10-CM | POA: Insufficient documentation

## 2012-12-05 DIAGNOSIS — F17200 Nicotine dependence, unspecified, uncomplicated: Secondary | ICD-10-CM | POA: Insufficient documentation

## 2012-12-05 NOTE — Assessment & Plan Note (Signed)
-   hfa 75% 12/02/12 - PFT's rec  She has improved since quit smoking and symbicort may not be needed here so rec she try using it prn  See instructions for specific recommendations which were reviewed directly with the patient who was given a copy with highlighter outlining the key components.

## 2012-12-05 NOTE — Assessment & Plan Note (Signed)
I took an extended  opportunity with this patient to outline the consequences of continued cigarette use  in airway disorders based on all the data we have from the multiple national lung health studies (perfomed over decades at millions of dollars in cost)  indicating that smoking cessation /maintenance of cessation, not choice of inhalers or physicians, is the most important aspect of care.

## 2012-12-05 NOTE — Assessment & Plan Note (Signed)
Symptoms are markedly disproportionate to objective findings and not clear this is a lung problem but pt does appear to have difficult airway management issues. DDX of  difficult airways managment all start with A and  include Adherence, Ace Inhibitors, Acid Reflux, Active Sinus Disease, Alpha 1 Antitripsin deficiency, Anxiety masquerading as Airways dz,  ABPA,  allergy(esp in young), Aspiration (esp in elderly), Adverse effects of DPI,  Active smokers, plus two Bs  = Bronchiectasis and Beta blocker use..and one C= CHF  Adherence is always the initial "prime suspect" and is a multilayered concern that requires a "trust but verify" approach in every patient - starting with knowing how to use medications, especially inhalers, correctly, keeping up with refills and understanding the fundamental difference between maintenance and prns vs those medications only taken for a very short course and then stopped and not refilled. The proper method of use, as well as anticipated side effects, of a metered-dose inhaler are discussed and demonstrated to the patient. Improved effectiveness after extensive coaching during this visit to a level of approximately  75% so ok to just use symbicort 160 2 q12 h prn.

## 2013-01-14 ENCOUNTER — Encounter: Payer: Self-pay | Admitting: Internal Medicine

## 2013-01-14 ENCOUNTER — Ambulatory Visit (INDEPENDENT_AMBULATORY_CARE_PROVIDER_SITE_OTHER): Payer: Medicare Other | Admitting: Internal Medicine

## 2013-01-14 VITALS — BP 102/58 | HR 75 | Temp 97.9°F | Ht 60.0 in | Wt 106.0 lb

## 2013-01-14 DIAGNOSIS — F17200 Nicotine dependence, unspecified, uncomplicated: Secondary | ICD-10-CM

## 2013-01-14 DIAGNOSIS — R05 Cough: Secondary | ICD-10-CM

## 2013-01-14 DIAGNOSIS — J449 Chronic obstructive pulmonary disease, unspecified: Secondary | ICD-10-CM

## 2013-01-14 DIAGNOSIS — R059 Cough, unspecified: Secondary | ICD-10-CM

## 2013-01-14 MED ORDER — BUDESONIDE-FORMOTEROL FUMARATE 160-4.5 MCG/ACT IN AERO: 2.0000 | INHALATION_SPRAY | Freq: Two times a day (BID) | RESPIRATORY_TRACT | Status: DC

## 2013-01-14 MED ORDER — ALBUTEROL SULFATE HFA 108 (90 BASE) MCG/ACT IN AERS: 2.0000 | INHALATION_SPRAY | Freq: Four times a day (QID) | RESPIRATORY_TRACT | Status: DC | PRN

## 2013-01-14 NOTE — Assessment & Plan Note (Addendum)
-   Quit smoking Feb 2014 - hfa 75% 12/02/12 - PFT's 01/14/2013 FEV1  0.70 (40%) with ratio 44 and 55% better with B2 wth dlco 42% corrects to 60  So she has a very large and significant ab component > perfect for symbicort 160 2 bid   The proper method of use, as well as anticipated side effects, of a metered-dose inhaler are discussed and demonstrated to the patient. Improved effectiveness after extensive coaching during this visit to a level of approximately  75% so needs to keep working on technique.    Each maintenance medication was reviewed in detail including most importantly the difference between maintenance and as needed and under what circumstances the prns are to be used.  Please see instructions for details which were reviewed in writing and the patient given a copy.

## 2013-01-14 NOTE — Progress Notes (Signed)
Subjective:    Patient ID: Victoria Kennedy, female    DOB: Oct 13, 1945  MRN: [id number]  HPI   20  yowf  With typical smoker's cough resolved after quit smoking 10/30/12 with heart attack and started on 02 and referred by [facility] for pulmonary evaluation.  DATE OF ADMISSION: 10/30/2012  DATE OF DISCHARGE: 11/06/2012  DISCHARGE SUMMARY  ADMITTING DIAGNOSES:  1. Acute coronary syndrome.  2. Bronchitis rule out pneumonia.  3. Chronic obstructive pulmonary disease.  4. Tobacco abuse.  5. Positive family history of coronary artery disease.  6. Osteopenia.  7. Degenerative joint disease.  FINAL DIAGNOSES:  1. Status post probable very small non-Q-wave myocardial infarction,  status post left catheterization.  2. Chronic obstructive pulmonary disease with mild bronchiectasis.  3. History of tobacco abuse.  4. Osteopenia.  5. Degenerative joint disease.  6. Malnutrition.  7. Deconditioning.  DISCHARGE HOME MEDICATIONS: Lovastatin 20 mg 1 tablet daily, Symbicort  160/4.5 mcg 2 puffs twice daily, feeding supplement 1 container 3 times  daily, levofloxacin 750 mg 1 tablet daily by mouth for 5 days,  metoprolol succinate 25 mg half tablet daily, Nitrostat 0.4 mg  sublingual use as directed, aspirin 81 mg 1 tablet daily, and O2 via  nasal cannula at 2 L/minute.   12/02/2012 1st pulmonary cc new pattern mostly dry cough and sense of post nasal drip comes and goes since d/c from hosp and started symbicort 160 2bid  And 02 at home at discharge. Limited by leg pains R not by breathing. Finished levaquin for productive cough one week prior to above admit and says this resolved. rec Change symbicort 160 2 every 12 hours if needed for your breathing but if your breathing is not limiting you from desired activities then you don't need it. Work on [employer], [employer].   01/14/2013 f/u ov/Daelyn Mozer  Chief Complaint  Patient presents with  . Follow-up    Breathing doing well, here to review PFT' done  today   could def tell the difference on 2bid vs lower doses  No obvious daytime variabilty or assoc excess mucus production or cp or chest tightness, subjective wheeze overt sinus or hb symptoms. No unusual exp hx or h/o childhood pna/ asthma or premature birth to his knowledge.   Sleeping ok without nocturnal  or early am exacerbation  of respiratory  c/o's or need for noct saba. Also denies any obvious fluctuation of symptoms with weather or environmental changes or other aggravating or alleviating factors except as outlined above   Current Medications, Allergies, Past Medical History, Past Surgical History, Family History, and Social History were reviewed in [employer] record.  ROS  The following are not active complaints unless bolded sore throat, dysphagia, dental problems, itching, sneezing,  nasal congestion or excess/ purulent secretions, ear ache,   fever, chills, sweats, unintended wt loss, pleuritic or exertional cp, hemoptysis,  orthopnea pnd or leg swelling, presyncope, palpitations, heartburn, abdominal pain, anorexia, nausea, vomiting, diarrhea  or change in bowel or urinary habits, change in stools or urine, dysuria,hematuria,  rash, arthralgias, visual complaints, headache, numbness weakness or ataxia or problems with walking or coordination,  change in mood/affect or memory.            Objective:   Physical Exam  Thin amb wf unusual affect     Wt Readings from Last 3 Encounters:  01/14/13 106 lb (48.081 kg)  12/02/12 103 lb (46.72 kg)  11/03/12 115 lb 14.4 oz (52.572 kg)  HEENT mild turbinate edema.  Oropharynx no thrush or excess pnd or cobblestoning.  No JVD or cervical adenopathy. Mild accessory muscle hypertrophy. Trachea midline, nl thryroid. Chest was hyperinflated by percussion with diminished breath sounds and moderate increased exp time without wheeze. Hoover sign positive at mid inspiration. Regular rate and rhythm without murmur  gallop or rub or increase P2 or edema.  Abd: no hsm, nl excursion. Ext warm without cyanosis or clubbing.   CT  10/30/12  Chronic lung disease with mild bronchiectasis in the right upper  lobe. No evidence of acute pulmonary embolism or focal pulmonary  consolidation      Assessment & Plan:

## 2013-01-14 NOTE — Patient Instructions (Addendum)
Plan A = automatic = symbicort 160 Take 2 puffs first thing in am and then another 2 puffs about 12 hours later.     Only use your albuterol (ventolin = Plan B)as a rescue medication to be used if you can't catch your breath by resting or doing a relaxed purse lip breathing pattern. The less you use it, the better it will work when you need it.   Please schedule a follow up visit in 3 months but call sooner if needed  Late add : need to clarify how/ when using 02 next ov

## 2013-01-14 NOTE — Progress Notes (Signed)
PFT done today. 

## 2013-01-18 NOTE — Assessment & Plan Note (Addendum)
Quit 10/2012 > Emphasized importance of maintaining abstinence to prevent progression to GOLD  III as she is close

## 2013-04-21 ENCOUNTER — Ambulatory Visit (INDEPENDENT_AMBULATORY_CARE_PROVIDER_SITE_OTHER): Payer: Medicare Other | Admitting: Internal Medicine

## 2013-04-21 ENCOUNTER — Encounter: Payer: Self-pay | Admitting: Internal Medicine

## 2013-04-21 VITALS — BP 108/54 | HR 95 | Temp 98.5°F | Ht 60.0 in | Wt 112.0 lb

## 2013-04-21 DIAGNOSIS — J441 Chronic obstructive pulmonary disease with (acute) exacerbation: Secondary | ICD-10-CM | POA: Insufficient documentation

## 2013-04-21 DIAGNOSIS — J9610 Chronic respiratory failure, unspecified whether with hypoxia or hypercapnia: Secondary | ICD-10-CM

## 2013-04-21 DIAGNOSIS — J449 Chronic obstructive pulmonary disease, unspecified: Secondary | ICD-10-CM

## 2013-04-21 MED ORDER — AZITHROMYCIN 250 MG PO TABS: ORAL_TABLET | ORAL | Status: DC

## 2013-04-21 MED ORDER — PREDNISONE (PAK) 10 MG PO TABS: ORAL_TABLET | ORAL | Status: DC

## 2013-04-21 NOTE — Progress Notes (Signed)
Subjective:    Patient ID: Victoria Kennedy, female    DOB: April 12, 1946  MRN: [id number]  HPI   53  yowf  With typical smoker's cough resolved after quit smoking 10/30/12 with heart attack and started on 02 and referred by [facility] for pulmonary evaluation.  DATE OF ADMISSION: 10/30/2012  DATE OF DISCHARGE: 11/06/2012  DISCHARGE SUMMARY  ADMITTING DIAGNOSES:  1. Acute coronary syndrome.  2. Bronchitis rule out pneumonia.  3. Chronic obstructive pulmonary disease.  4. Tobacco abuse.  5. Positive family history of coronary artery disease.  6. Osteopenia.  7. Degenerative joint disease.  FINAL DIAGNOSES:  1. Status post probable very small non-Q-wave myocardial infarction,  status post left catheterization.  2. Chronic obstructive pulmonary disease with mild bronchiectasis.  3. History of tobacco abuse.  4. Osteopenia.  5. Degenerative joint disease.  6. Malnutrition.  7. Deconditioning.  DISCHARGE HOME MEDICATIONS: Lovastatin 20 mg 1 tablet daily, Symbicort  160/4.5 mcg 2 puffs twice daily, feeding supplement 1 container 3 times  daily, levofloxacin 750 mg 1 tablet daily by mouth for 5 days,  metoprolol succinate 25 mg half tablet daily, Nitrostat 0.4 mg  sublingual use as directed, aspirin 81 mg 1 tablet daily, and O2 via  nasal cannula at 2 L/minute.   12/02/2012 1st pulmonary cc new pattern mostly dry cough and sense of post nasal drip comes and goes since d/c from hosp and started symbicort 160 2bid  And 02 at home at discharge. Limited by leg pains R not by breathing. Finished levaquin for productive cough one week prior to above admit and says this resolved. rec Change symbicort 160 2 every 12 hours if needed for your breathing but if your breathing is not limiting you from desired activities then you don't need it. Work on [employer]er, [employer].   01/14/2013 f/u ov/Scotty Pinder  Chief Complaint  Patient presents with  . Follow-up    Breathing doing well, here to review PFT' done  today   could def tell the difference on 2bid vs lower doses rec Plan A = automatic = symbicort 160 Take 2 puffs first thing in am and then another 2 puffs about 12 hours later.  Only use your albuterol (ventolin = Plan B)as a rescue medication   Late add : need to clarify how/ when using 02 next ov   04/21/2013 f/u ov/Kaidyn Hernandes re COPD on 02 qhs and prn daytime but no port system Chief Complaint  Patient presents with  . Follow-up    Pt states having increased cough and SOB x 1 wk. Cough is prod with moderate green to grey sputum.  She has also had chills and sweats but unaware if she has had fever.       No obvious daytime variabilty or assoc  cp or chest tightness, subjective wheeze overt sinus or hb symptoms. No unusual exp hx or h/o childhood pna/ asthma or premature birth to his knowledge.   Sleeping ok without nocturnal  or early am exacerbation  of respiratory  c/o's or need for noct saba. Also denies any obvious fluctuation of symptoms with weather or environmental changes or other aggravating or alleviating factors except as outlined above   Current Medications, Allergies, Past Medical History, Past Surgical History, Family History, and Social History were reviewed in [employer] record.  ROS  The following are not active complaints unless bolded sore throat, dysphagia, dental problems, itching, sneezing,  nasal congestion or excess/ purulent secretions, ear ache,   fever, chills,  sweats, unintended wt loss, pleuritic or exertional cp, hemoptysis,  orthopnea pnd or leg swelling, presyncope, palpitations, heartburn, abdominal pain, anorexia, nausea, vomiting, diarrhea  or change in bowel or urinary habits, change in stools or urine, dysuria,hematuria,  rash, arthralgias, visual complaints, headache, numbness weakness or ataxia or problems with walking or coordination,  change in mood/affect or memory.            Objective:   Physical Exam  Thin amb wf  unusual affect     04/22/2013       112 Wt Readings from Last 3 Encounters:  01/14/13 106 lb (48.081 kg)  12/02/12 103 lb (46.72 kg)  11/03/12 115 lb 14.4 oz (52.572 kg)     HEENT mild turbinate edema.  Oropharynx no thrush or excess pnd or cobblestoning.  No JVD or cervical adenopathy. Mild accessory muscle hypertrophy. Trachea midline, nl thryroid. Chest was hyperinflated by percussion with diminished breath sounds and moderate increased exp time without wheeze. Hoover sign positive at mid inspiration. Regular rate and rhythm without murmur gallop or rub or increase P2 or edema.  Abd: no hsm, nl excursion. Ext warm without cyanosis or clubbing.   CT  10/30/12  Chronic lung disease with mild bronchiectasis in the right upper  lobe. No evidence of acute pulmonary embolism or focal pulmonary  consolidation      Assessment & Plan:

## 2013-04-21 NOTE — Patient Instructions (Addendum)
Plan A = automatic = symbicort 160 Take 2 puffs first thing in am and then another 2 puffs about 12 hours later.   Only use your albuterol (ventolin = Plan B)as a rescue medication to be used if you can't catch your breath by resting or doing a relaxed purse lip breathing pattern. The less you use it, the better it will work when you need it.   Zpak and prednisone called in   Please see patient coordinator before you leave today  to schedule portable system for 02 to use any time you need it with activity but not needed at rest  Please schedule a follow up visit in 3 months but call sooner if needed

## 2013-04-22 NOTE — Assessment & Plan Note (Signed)
rx zpak/prednisone

## 2013-04-22 NOTE — Assessment & Plan Note (Signed)
-   04/21/2013  Walked RA x 1 laps @ 185 ft each stopped due to  sats 86% > corrected on 2lpm so use 2lpm with activity, none needed at rest

## 2013-04-22 NOTE — Assessment & Plan Note (Signed)
-   PFT's 01/14/2013 FEV1  0.70 (40%) with ratio 44 and 55% wth dlco 42% corrects to 60  Mild aecopd (discussed separately) but maint rx seems adequate, reviewed

## 2013-07-21 ENCOUNTER — Ambulatory Visit: Payer: Medicare Other | Admitting: Internal Medicine

## 2013-07-30 ENCOUNTER — Ambulatory Visit (INDEPENDENT_AMBULATORY_CARE_PROVIDER_SITE_OTHER): Payer: Medicare Other | Admitting: Internal Medicine

## 2013-07-30 ENCOUNTER — Encounter: Payer: Self-pay | Admitting: Internal Medicine

## 2013-07-30 VITALS — BP 104/62 | HR 80 | Temp 98.2°F | Ht 60.5 in | Wt 121.0 lb

## 2013-07-30 DIAGNOSIS — J449 Chronic obstructive pulmonary disease, unspecified: Secondary | ICD-10-CM

## 2013-07-30 DIAGNOSIS — J9610 Chronic respiratory failure, unspecified whether with hypoxia or hypercapnia: Secondary | ICD-10-CM

## 2013-07-30 NOTE — Progress Notes (Signed)
Subjective:    Patient ID: Victoria Kennedy, female    DOB: [birth year]  MRN: [id number]    Brief patient profile:  38 yowf  With typical smoker's cough resolved after quit smoking 10/30/12 with heart attack and started on 02 and referred by [facility] for pulmonary evaluation.   History of Present Illness  DATE OF ADMISSION: 10/30/2012  DATE OF DISCHARGE: 11/06/2012  DISCHARGE SUMMARY  ADMITTING DIAGNOSES:  1. Acute coronary syndrome.  2. Bronchitis rule out pneumonia.  3. Chronic obstructive pulmonary disease.  4. Tobacco abuse.  5. Positive family history of coronary artery disease.  6. Osteopenia.  7. Degenerative joint disease.  FINAL DIAGNOSES:  1. Status post probable very small non-Q-wave myocardial infarction,  status post left catheterization.  2. Chronic obstructive pulmonary disease with mild bronchiectasis.  3. History of tobacco abuse.  4. Osteopenia.  5. Degenerative joint disease.  6. Malnutrition.  7. Deconditioning.  DISCHARGE HOME MEDICATIONS: Lovastatin 20 mg 1 tablet daily, Symbicort  160/4.5 mcg 2 puffs twice daily, feeding supplement 1 container 3 times  daily, levofloxacin 750 mg 1 tablet daily by mouth for 5 days,  metoprolol succinate 25 mg half tablet daily, Nitrostat 0.4 mg  sublingual use as directed, aspirin 81 mg 1 tablet daily, and O2 via  nasal cannula at 2 L/minute.   12/02/2012 1st pulmonary cc new pattern mostly dry cough and sense of post nasal drip comes and goes since d/c from hosp and started symbicort 160 2bid  And 02 at home at discharge. Limited by leg pains R not by breathing. Finished levaquin for productive cough one week prior to above admit and says this resolved. rec Change symbicort 160 2 every 12 hours if needed for your breathing but if your breathing is not limiting you from desired activities then you don't need it. Work on Designer, multimedia.   01/14/2013 f/u ov/Arif Amendola  Chief Complaint  Patient presents with  . Follow-up   Breathing doing well, here to review PFT' done today   could def tell the difference on 2bid vs lower doses rec Plan A = automatic = symbicort 160 Take 2 puffs first thing in am and then another 2 puffs about 12 hours later.  Only use your albuterol (ventolin = Plan B)as a rescue medication   Late add : need to clarify how/ when using 02 next ov   04/21/2013 f/u ov/Kilyn Maragh re COPD on 02 qhs and prn daytime but no port system Chief Complaint  Patient presents with  . Follow-up    Pt states having increased cough and SOB x 1 wk. Cough is prod with moderate green to grey sputum.  She has also had chills and sweats but unaware if she has had fever.    rec Plan A = automatic = symbicort 160 Take 2 puffs first thing in am and then another 2 puffs about 12 hours later.  Only use your albuterol (ventolin = Plan B)as a rescue medication to be used if you can't catch your breath by resting or doing a relaxed purse lip breathing pattern. The less you use it, the better it will work when you need it.  Zpak and prednisone called in   07/30/2013 f/u ov/Naelani Lafrance re: copd/ on noct 02 prn amb 02 Chief Complaint  Patient presents with  . Follow-up    Pt states that her breathing has been worse x 2 days. She relates to the change in temperature.   avg use saba is once a day "if  over does it" poor insight into how much she could use it.  Says having problems with dme re portable 02 rx   No obvious daytime variabilty or assoc cough or cp or chest tightness, subjective wheeze overt sinus or hb symptoms. No unusual exp hx or h/o childhood pna/ asthma or premature birth to his knowledge.   Sleeping ok without nocturnal  or early am exacerbation  of respiratory  c/o's or need for noct saba. Also denies any obvious fluctuation of symptoms with weather or environmental changes or other aggravating or alleviating factors except as outlined above   Current Medications, Allergies, Past Medical History, Past Surgical History,  Family History, and Social History were reviewed in [employer] record.  ROS  The following are not active complaints unless bolded sore throat, dysphagia, dental problems, itching, sneezing,  nasal congestion or excess/ purulent secretions, ear ache,   fever, chills, sweats, unintended wt loss, pleuritic or exertional cp, hemoptysis,  orthopnea pnd or leg swelling, presyncope, palpitations, heartburn, abdominal pain, anorexia, nausea, vomiting, diarrhea  or change in bowel or urinary habits, change in stools or urine, dysuria,hematuria,  rash, arthralgias, visual complaints, headache, numbness weakness or ataxia or problems with walking or coordination,  change in mood/affect or memory.            Objective:   Physical Exam  Thin amb wf unusual affect / very evasive with responses  04/22/2013       112 > 07/30/2013  121  Wt Readings from Last 3 Encounters:  01/14/13 106 lb (48.081 kg)  12/02/12 103 lb (46.72 kg)  11/03/12 115 lb 14.4 oz (52.572 kg)     HEENT mild turbinate edema.  Oropharynx no thrush or excess pnd or cobblestoning.  No JVD or cervical adenopathy. Mild accessory muscle hypertrophy. Trachea midline, nl thryroid. Chest was hyperinflated by percussion with diminished breath sounds and moderate increased exp time without wheeze. Hoover sign positive at mid inspiration. Regular rate and rhythm without murmur gallop or rub or increase P2 or edema.  Abd: no hsm, nl excursion. Ext warm without cyanosis or clubbing.   CT  10/30/12  Chronic lung disease with mild bronchiectasis in the right upper  lobe. No evidence of acute pulmonary embolism or focal pulmonary  consolidation      Assessment & Plan:   Outpatient Encounter Prescriptions as of 07/30/2013  Medication Sig  . albuterol (PROVENTIL HFA;VENTOLIN HFA) 108 (90 BASE) MCG/ACT inhaler Inhale 2 puffs into the lungs every 6 (six) hours as needed for wheezing.  Marland Kitchen aspirin EC 81 MG tablet Take 81 mg by  mouth daily.  Marland Kitchen atorvastatin (LIPITOR) 20 MG tablet Take 20 mg by mouth daily at 6 PM.  . budesonide-formoterol (SYMBICORT) 160-4.5 MCG/ACT inhaler Inhale 2 puffs into the lungs 2 (two) times daily.  Marland Kitchen docusate sodium (COLACE) 100 MG capsule Take 100 mg by mouth at bedtime.  . nitroGLYCERIN (NITROSTAT) 0.4 MG SL tablet Place 0.4 mg under the tongue every 5 (five) minutes x 3 doses as needed for chest pain.  . polyethylene glycol (MIRALAX / GLYCOLAX) packet Take 17 g by mouth daily as needed.   . [DISCONTINUED] azithromycin (ZITHROMAX) 250 MG tablet Take 2 on day one then 1 daily x 4 days  . [DISCONTINUED] metoprolol succinate (TOPROL-XL) 25 MG 24 hr tablet Take 12.5 mg by mouth daily.  . [DISCONTINUED] predniSONE (STERAPRED UNI-PAK) 10 MG tablet Prednisone 10 mg take  4 each am x 2 days,   2  each am x 2 days,  1 each am x2days and stop

## 2013-07-30 NOTE — Patient Instructions (Addendum)
Please see patient coordinator before you leave today  to sort out your ambulatory 02   Plan A = automatic = symbicort 160 Take 2 puffs first thing in am and then another 2 puffs about 12 hours later.   Only use your albuterol as a rescue medication to be used if you can't catch your breath by resting or doing a relaxed purse lip breathing pattern.  - The less you use it, the better it will work when you need it. - Ok to use up to 2pffs every 4 hours if you must but call for immediate appointment if use goes up over your usual need - Don't leave home without it !!  (think of it like your spare tire for your car) .    If you are satisfied with your treatment plan let your doctor know and he/she can either refill your medications or you can return here when your prescription runs out.     If in any way you are not 100% satisfied,  please tell us.  If 100% better, tell your friends!   Pulmonary follow up is as needed

## 2013-07-31 NOTE — Assessment & Plan Note (Addendum)
-   PFT's 01/14/2013 FEV1  0.70 (40%) with ratio 44 and 55%  p B2 wth dlco 42% corrects to 60  The proper method of use, as well as anticipated side effects, of a metered-dose inhaler are discussed and demonstrated to the patient. Improved effectiveness after extensive coaching during this visit to a level of approximately  90% so should do fine with just the symbicort 160 2 bid for now.    Each maintenance medication was reviewed in detail including most importantly the difference between maintenance and as needed and under what circumstances the prns are to be used.  Please see instructions for details which were reviewed in writing and the patient given a copy.    Pulmonary f/u can be prn

## 2013-07-31 NOTE — Assessment & Plan Note (Signed)
-   04/21/2013  Walked RA x 1 laps @ 185 ft each stopped due to  sats 86% > corrected on 2lpm  Should be no problem certifying her for amb 02 and she should continue to wear it with exertion eg walking more than around the house and at hs indefinitely though will need re-eval in July to see if absence of smoking over there next year improves status.

## 2013-08-25 ENCOUNTER — Telehealth: Payer: Self-pay | Admitting: Internal Medicine

## 2013-08-25 NOTE — Telephone Encounter (Signed)
Per pt, [employer] has denied claim to receive portable O2. Pt states that [employer] states that she did not "ask for permission" before order being placed. Order placed on 04/21/13 by MW. Last seen by MW in 07/30/2013.   Will send to [facility] to see if they can help in this situation.

## 2013-08-27 NOTE — Telephone Encounter (Signed)
Spoke to pt she is aware 1st claim was denied because the wrong code was summited and that has been changed now so she should get a new one stating it is authorized Victoria Kennedy

## 2013-12-29 ENCOUNTER — Telehealth: Payer: Self-pay | Admitting: Internal Medicine

## 2013-12-29 NOTE — Telephone Encounter (Signed)
Spoke with the pt  She states that she is needing order for [employer] POC  I advised will need ov for qualifying sats  She verbalized understanding  OV with MW for 01/10/14  Nothing further needed

## 2014-01-10 ENCOUNTER — Ambulatory Visit: Payer: Medicare Other | Admitting: Internal Medicine

## 2014-03-02 ENCOUNTER — Encounter: Payer: Self-pay | Admitting: Internal Medicine

## 2014-03-02 ENCOUNTER — Ambulatory Visit (INDEPENDENT_AMBULATORY_CARE_PROVIDER_SITE_OTHER)
Admission: RE | Admit: 2014-03-02 | Discharge: 2014-03-02 | Disposition: A | Discharge status: Home / Self Care | Payer: Medicare Other | Source: Ambulatory Visit | Attending: Internal Medicine | Admitting: Internal Medicine

## 2014-03-02 ENCOUNTER — Ambulatory Visit (INDEPENDENT_AMBULATORY_CARE_PROVIDER_SITE_OTHER): Payer: Medicare Other | Admitting: Internal Medicine

## 2014-03-02 VITALS — BP 106/62 | HR 93 | Temp 98.7°F | Ht 60.0 in | Wt 127.0 lb

## 2014-03-02 DIAGNOSIS — J9610 Chronic respiratory failure, unspecified whether with hypoxia or hypercapnia: Secondary | ICD-10-CM

## 2014-03-02 DIAGNOSIS — J449 Chronic obstructive pulmonary disease, unspecified: Secondary | ICD-10-CM

## 2014-03-02 DIAGNOSIS — J441 Chronic obstructive pulmonary disease with (acute) exacerbation: Secondary | ICD-10-CM

## 2014-03-02 MED ORDER — PREDNISONE 10 MG PO TABS: ORAL_TABLET | ORAL | Status: DC

## 2014-03-02 MED ORDER — AZITHROMYCIN 250 MG PO TABS: ORAL_TABLET | ORAL | Status: DC

## 2014-03-02 MED ORDER — BUDESONIDE-FORMOTEROL FUMARATE 160-4.5 MCG/ACT IN AERO
INHALATION_SPRAY | RESPIRATORY_TRACT | Status: AC
Start: 2014-03-02 — End: ?

## 2014-03-02 NOTE — Progress Notes (Signed)
Subjective:    Patient ID: Victoria Kennedy, female    DOB: Aug 30, 1946  MRN: [id number]    Brief patient profile:  84 yowf  With typical smoker's cough resolved after quit smoking 10/30/12 with heart attack and started on 02 and referred by [facility] for pulmonary evaluation with documented GOLD II COPD 01/14/13   History of Present Illness  DATE OF ADMISSION: 10/30/2012  DATE OF DISCHARGE: 11/06/2012  DISCHARGE SUMMARY  ADMITTING DIAGNOSES:  1. Acute coronary syndrome.  2. Bronchitis rule out pneumonia.  3. Chronic obstructive pulmonary disease.  4. Tobacco abuse.  5. Positive family history of coronary artery disease.  6. Osteopenia.  7. Degenerative joint disease.  FINAL DIAGNOSES:  1. Status post probable very small non-Q-wave myocardial infarction,  status post left catheterization.  2. Chronic obstructive pulmonary disease with mild bronchiectasis.  3. History of tobacco abuse.  4. Osteopenia.  5. Degenerative joint disease.  6. Malnutrition.  7. Deconditioning.  DISCHARGE HOME MEDICATIONS: Lovastatin 20 mg 1 tablet daily, Symbicort  160/4.5 mcg 2 puffs twice daily, feeding supplement 1 container 3 times  daily, levofloxacin 750 mg 1 tablet daily by mouth for 5 days,  metoprolol succinate 25 mg half tablet daily, Nitrostat 0.4 mg  sublingual use as directed, aspirin 81 mg 1 tablet daily, and O2 via  nasal cannula at 2 L/minute.   12/02/2012 1st pulmonary cc new pattern mostly dry cough and sense of post nasal drip comes and goes since d/c from hosp and started symbicort 160 2bid  And 02 at home at discharge. Limited by leg pains R not by breathing. Finished levaquin for productive cough one week prior to above admit and says this resolved. rec Change symbicort 160 2 every 12 hours if needed for your breathing but if your breathing is not limiting you from desired activities then you don't need it. Work on Doctor, hospital.   01/14/2013 f/u ov/[place]  Chief Complaint   Patient presents with  . Follow-up    Breathing doing well, here to review PFT' done today   could def tell the difference on 2bid vs lower doses rec Plan A = automatic = symbicort 160 Take 2 puffs first thing in am and then another 2 puffs about 12 hours later.  Only use your albuterol (ventolin = Plan B)as a rescue medication   Late add : need to clarify how/ when using 02 next ov   04/21/2013 f/u ov/Arihant Pennings re COPD on 02 qhs and prn daytime but no port system Chief Complaint  Patient presents with  . Follow-up    Pt states having increased cough and SOB x 1 wk. Cough is prod with moderate green to grey sputum.  She has also had chills and sweats but unaware if she has had fever.    rec Plan A = automatic = symbicort 160 Take 2 puffs first thing in am and then another 2 puffs about 12 hours later.  Only use your albuterol (ventolin = Plan B)as a rescue medication to be used if you can't catch your breath by resting or doing a relaxed purse lip breathing pattern. The less you use it, the better it will work when you need it.  Zpak and prednisone called in   07/30/2013 f/u ov/Linh Johannes re: copd/ on noct 02 prn amb 02 Chief Complaint  Patient presents with  . Follow-up    Pt states that her breathing has been worse x 2 days. She relates to the change in temperature.  avg use saba is once a day "if over does it" poor insight into how much she could use it.  Says having problems with dme re portable 02 rx  rec Please see patient coordinator before you leave today  to sort out your ambulatory 02  Plan A = automatic = symbicort 160 Take 2 puffs first thing in am and then another 2 puffs about 12 hours later.  Only use your albuterol as a rescue medication     03/02/2014  Acute ov/Rubbie Goostree re: aecopd GOLD II maint on symbicort 160 2bid/ 02 dep @ 2lpm Chief Complaint  Patient presents with  . Acute Visit    Pt c/o sometimes prod cough with gray/green mucous, runny nose, PND, sinus congestion X1.5  weeks.      Acute onset cough / congestion / worse sob and started using saba twice daily , unclear on max doses.  No obvious daytime variabilty or assoc  cp or chest tightness, subjective wheeze overt sinus or hb symptoms. No unusual exp hx or h/o childhood pna/ asthma or premature birth to his knowledge.   Sleeping ok without nocturnal  or early am exacerbation  of respiratory  c/o's or need for noct saba. Also denies any obvious fluctuation of symptoms with weather or environmental changes or other aggravating or alleviating factors except as outlined above   Current Medications, Allergies, Past Medical History, Past Surgical History, Family History, and Social History were reviewed in [employer] record.  ROS  The following are not active complaints unless bolded sore throat, dysphagia, dental problems, itching, sneezing,  nasal congestion or excess/ purulent secretions, ear ache,   fever, chills, sweats, unintended wt loss, pleuritic or exertional cp, hemoptysis,  orthopnea pnd or leg swelling, presyncope, palpitations, heartburn, abdominal pain, anorexia, nausea, vomiting, diarrhea  or change in bowel or urinary habits, change in stools or urine, dysuria,hematuria,  rash, arthralgias, visual complaints, headache, numbness weakness or ataxia or problems with walking or coordination,  change in mood/affect or memory.            Objective:   Physical Exam  Thin amb wf unusual affect / very evasive with responses  04/22/2013       112 > 07/30/2013  121 > 03/02/2014 127  Wt Readings from Last 3 Encounters:  01/14/13 106 lb (48.081 kg)  12/02/12 103 lb (46.72 kg)  11/03/12 115 lb 14.4 oz (52.572 kg)     HEENT mild turbinate edema.  Oropharynx no thrush or excess pnd or cobblestoning.  No JVD or cervical adenopathy. Mild accessory muscle hypertrophy. Trachea midline, nl thryroid. Chest was hyperinflated by percussion with diminished breath sounds and moderate  increased exp time without wheeze. Hoover sign positive at mid inspiration. Regular rate and rhythm without murmur gallop or rub or increase P2 or edema.  Abd: no hsm, nl excursion. Ext warm without cyanosis or clubbing.     CT  10/30/12  Chronic lung disease with mild bronchiectasis in the right upper  lobe. No evidence of acute pulmonary embolism or focal pulmonary  Consolidation  cxr 03/02/14 The heart size and mediastinal contours are within normal limits. The lungs are hyperinflated. No focal regions of consolidation or focal infiltrates. Minimal areas of linear density within the lung bases. No acute osseous abnormalities.       Assessment & Plan:

## 2014-03-02 NOTE — Patient Instructions (Addendum)
Work on inhaler technique:  relax and gently blow all the way out then take a nice smooth deep breath back in, triggering the inhaler at same time you start breathing in.  Hold for up to 5 seconds if you can.  Rinse and gargle with water when done  zpak  Prednisone 10 mg take  4 each am x 2 days,   2 each am x 2 days,  1 each am x 2 days and stop   Please remember to go to the  x-ray department downstairs for your tests - we will call you with the results when they are available.  Pulmonary follow up is as needed

## 2014-03-03 NOTE — Assessment & Plan Note (Signed)
Acute flare in setting of GOLD II copd, no evidence pna > rx pred/zpak

## 2014-03-03 NOTE — Assessment & Plan Note (Signed)
-   04/21/2013  Walked RA x 1 laps @ 185 ft each stopped due to  sats 86% > corrected on 2lpm  Adequate control on present rx, reviewed > no change in rx needed

## 2014-03-03 NOTE — Assessment & Plan Note (Signed)
-   Quit smoking Feb 2014 - PFT's 01/14/2013 FEV1  0.70 (40%) with ratio 44 and 55% p B2 wth dlco 42% corrects to 60  Fairly well compensated despite aecopd but does not understand how/ when to use saba effectively  The proper method of use, as well as anticipated side effects, of a metered-dose inhaler are discussed and demonstrated to the patient. Improved effectiveness after extensive coaching during this visit to a level of approximately 75%    Each maintenance medication was reviewed in detail including most importantly the difference between maintenance and as needed and under what circumstances the prns are to be used.  Please see instructions for details which were reviewed in writing and the patient given a copy.

## 2014-09-01 ENCOUNTER — Encounter (HOSPITAL_COMMUNITY): Payer: Self-pay | Admitting: Cardiology

## 2014-09-27 ENCOUNTER — Ambulatory Visit (INDEPENDENT_AMBULATORY_CARE_PROVIDER_SITE_OTHER): Payer: Medicare Other | Admitting: Adult Health

## 2014-09-27 ENCOUNTER — Encounter: Payer: Self-pay | Admitting: Adult Health

## 2014-09-27 VITALS — BP 128/72 | HR 77 | Temp 98.0°F | Ht 60.0 in | Wt 136.2 lb

## 2014-09-27 DIAGNOSIS — J9610 Chronic respiratory failure, unspecified whether with hypoxia or hypercapnia: Secondary | ICD-10-CM

## 2014-09-27 DIAGNOSIS — J441 Chronic obstructive pulmonary disease with (acute) exacerbation: Secondary | ICD-10-CM

## 2014-09-27 DIAGNOSIS — H6090 Unspecified otitis externa, unspecified ear: Secondary | ICD-10-CM

## 2014-09-27 MED ORDER — AMOXICILLIN-POT CLAVULANATE 875-125 MG PO TABS: 1.0000 | ORAL_TABLET | Freq: Two times a day (BID) | ORAL | Status: AC

## 2014-09-27 MED ORDER — PREDNISONE 10 MG PO TABS: ORAL_TABLET | ORAL | Status: DC

## 2014-09-27 NOTE — Progress Notes (Signed)
Subjective:    Patient ID: Victoria Kennedy, female    DOB: Aug 30, 1946  MRN: [id number]    Brief patient profile:  84 yowf  With typical smoker's cough resolved after quit smoking 10/30/12 with heart attack and started on 02 and referred by [facility] for pulmonary evaluation with documented GOLD II COPD 01/14/13   History of Present Illness  DATE OF ADMISSION: 10/30/2012  DATE OF DISCHARGE: 11/06/2012  DISCHARGE SUMMARY  ADMITTING DIAGNOSES:  1. Acute coronary syndrome.  2. Bronchitis rule out pneumonia.  3. Chronic obstructive pulmonary disease.  4. Tobacco abuse.  5. Positive family history of coronary artery disease.  6. Osteopenia.  7. Degenerative joint disease.  FINAL DIAGNOSES:  1. Status post probable very small non-Q-wave myocardial infarction,  status post left catheterization.  2. Chronic obstructive pulmonary disease with mild bronchiectasis.  3. History of tobacco abuse.  4. Osteopenia.  5. Degenerative joint disease.  6. Malnutrition.  7. Deconditioning.  DISCHARGE HOME MEDICATIONS: Lovastatin 20 mg 1 tablet daily, Symbicort  160/4.5 mcg 2 puffs twice daily, feeding supplement 1 container 3 times  daily, levofloxacin 750 mg 1 tablet daily by mouth for 5 days,  metoprolol succinate 25 mg half tablet daily, Nitrostat 0.4 mg  sublingual use as directed, aspirin 81 mg 1 tablet daily, and O2 via  nasal cannula at 2 L/minute.   12/02/2012 1st pulmonary cc new pattern mostly dry cough and sense of post nasal drip comes and goes since d/c from hosp and started symbicort 160 2bid  And 02 at home at discharge. Limited by leg pains R not by breathing. Finished levaquin for productive cough one week prior to above admit and says this resolved. rec Change symbicort 160 2 every 12 hours if needed for your breathing but if your breathing is not limiting you from desired activities then you don't need it. Work on Doctor, hospital.   01/14/2013 f/u ov/[place]  Chief Complaint   Patient presents with  . Follow-up    Breathing doing well, here to review PFT' done today   could def tell the difference on 2bid vs lower doses rec Plan A = automatic = symbicort 160 Take 2 puffs first thing in am and then another 2 puffs about 12 hours later.  Only use your albuterol (ventolin = Plan B)as a rescue medication   Late add : need to clarify how/ when using 02 next ov   04/21/2013 f/u ov/Wert re COPD on 02 qhs and prn daytime but no port system Chief Complaint  Patient presents with  . Follow-up    Pt states having increased cough and SOB x 1 wk. Cough is prod with moderate green to grey sputum.  She has also had chills and sweats but unaware if she has had fever.    rec Plan A = automatic = symbicort 160 Take 2 puffs first thing in am and then another 2 puffs about 12 hours later.  Only use your albuterol (ventolin = Plan B)as a rescue medication to be used if you can't catch your breath by resting or doing a relaxed purse lip breathing pattern. The less you use it, the better it will work when you need it.  Zpak and prednisone called in   07/30/2013 f/u ov/Wert re: copd/ on noct 02 prn amb 02 Chief Complaint  Patient presents with  . Follow-up    Pt states that her breathing has been worse x 2 days. She relates to the change in temperature.  avg use saba is once a day "if over does it" poor insight into how much she could use it.  Says having problems with dme re portable 02 rx  rec Please see patient coordinator before you leave today  to sort out your ambulatory 02  Plan A = automatic = symbicort 160 Take 2 puffs first thing in am and then another 2 puffs about 12 hours later.  Only use your albuterol as a rescue medication     03/02/2014  Acute ov/Wert re: aecopd GOLD II maint on symbicort 160 2bid/ 02 dep @ 2lpm Chief Complaint  Patient presents with  . Acute Visit    Pt c/o sometimes prod cough with gray/green mucous, runny nose, PND, sinus congestion X1.5  weeks.      Acute onset cough / congestion / worse sob and started using saba twice daily , unclear on max doses.  No obvious daytime variabilty or assoc  cp or chest tightness, subjective wheeze overt sinus or hb symptoms. No unusual exp hx or h/o childhood pna/ asthma or premature birth to his knowledge.   Sleeping ok without nocturnal  or early am exacerbation  of respiratory  c/o's or need for noct saba. Also denies any obvious fluctuation of symptoms with weather or environmental changes or other aggravating or alleviating factors except as outlined above  >zpack and steroid taper   09/27/14 Acute OV  Complains of MW pt here for prod cough with green mucus, increased SOB, tightness, wheezing x2 weeks.   Denies any f/c/s, n/v/d, hemoptysis.  Complains ear remain stopped up and very dry and itchy.  Using mucinex without much relief.   Current Medications, Allergies, Past Medical History, Past Surgical History, Family History, and Social History were reviewed in [employer] record.  ROS  The following are not active complaints unless bolded sore throat, dysphagia, dental problems, itching, exertional cp, hemoptysis,  orthopnea pnd or leg swelling, presyncope, palpitations, heartburn, abdominal pain, anorexia, nausea, vomiting, diarrhea  or change in bowel or urinary habits, change in stools or urine, dysuria,hematuria,  rash, arthralgias, visual complaints, headache, numbness weakness or ataxia or problems with walking or coordination,  change in mood/affect or memory.            Objective:   Physical Exam  Thin amb wf unusual affect  04/22/2013       112 > 07/30/2013  121 > 03/02/2014 127 >136 09/27/14   HEENT mild turbinate edema.  Oropharynx no thrush or excess pnd or cobblestoning.EAC dry scaly skin  No JVD or cervical adenopathy. Mild accessory muscle hypertrophy. Trachea midline, nl thryroid. Chest was hyperinflated by percussion with diminished breath sounds  with few exp wheezes Hoover sign positive at mid inspiration. Regular rate and rhythm without murmur gallop or rub or increase P2 or edema.  Abd: no hsm, nl excursion. Ext warm without cyanosis or clubbing.     CT  10/30/12  Chronic lung disease with mild bronchiectasis in the right upper  lobe. No evidence of acute pulmonary embolism or focal pulmonary  Consolidation  cxr 03/02/14 The heart size and mediastinal contours are within normal limits. The lungs are hyperinflated. No focal regions of consolidation or focal infiltrates. Minimal areas of linear density within the lung bases. No acute osseous abnormalities.       Assessment & Plan:

## 2014-09-27 NOTE — Patient Instructions (Signed)
Augmentin 875mg  Twice daily  For 7 days  Mucinex DM Twice daily   Prednisone taper over next week.  Follow back up with ENT for ears.  Please contact office for sooner follow up if symptoms do not improve or worsen or seek emergency care  Follow up Dr. Melvyn Novas  In 3 months and As needed

## 2014-10-04 DIAGNOSIS — H6090 Unspecified otitis externa, unspecified ear: Secondary | ICD-10-CM | POA: Insufficient documentation

## 2014-10-04 NOTE — Assessment & Plan Note (Signed)
Follow up with ENT as planned

## 2014-10-04 NOTE — Assessment & Plan Note (Signed)
Continue on current O2

## 2014-10-04 NOTE — Assessment & Plan Note (Signed)
Exacerbation   Plan  Augmentin 875mg  Twice daily  For 7 days  Mucinex DM Twice daily   Prednisone taper over next week.  Follow back up with ENT for ears.  Please contact office for sooner follow up if symptoms do not improve or worsen or seek emergency care  Follow up Dr. Melvyn Novas  In 3 months and As needed

## 2014-10-06 ENCOUNTER — Encounter (HOSPITAL_COMMUNITY): Payer: Self-pay | Admitting: Orthopedic Surgery

## 2014-12-05 ENCOUNTER — Other Ambulatory Visit (HOSPITAL_COMMUNITY): Payer: Self-pay | Admitting: Cardiology

## 2014-12-05 DIAGNOSIS — R0989 Other specified symptoms and signs involving the circulatory and respiratory systems: Secondary | ICD-10-CM

## 2014-12-07 ENCOUNTER — Ambulatory Visit (HOSPITAL_COMMUNITY)
Admission: RE | Admit: 2014-12-07 | Discharge: 2014-12-07 | Disposition: A | Discharge status: Home / Self Care | Payer: Medicare Other | Source: Ambulatory Visit | Attending: Cardiology | Admitting: Cardiology

## 2014-12-07 ENCOUNTER — Emergency Department (HOSPITAL_COMMUNITY)
Admission: EM | Admit: 2014-12-07 | Discharge: 2014-12-07 | Disposition: A | Discharge status: Home / Self Care | Payer: Medicare Other | Source: Home / Self Care | Attending: Family Medicine | Admitting: Family Medicine

## 2014-12-07 ENCOUNTER — Encounter (HOSPITAL_COMMUNITY): Payer: Self-pay

## 2014-12-07 ENCOUNTER — Ambulatory Visit (HOSPITAL_COMMUNITY): Payer: Medicare Other

## 2014-12-07 DIAGNOSIS — T148XXA Other injury of unspecified body region, initial encounter: Principal | ICD-10-CM

## 2014-12-07 DIAGNOSIS — T148 Other injury of unspecified body region: Secondary | ICD-10-CM | POA: Diagnosis not present

## 2014-12-07 DIAGNOSIS — L03113 Cellulitis of right upper limb: Secondary | ICD-10-CM

## 2014-12-07 DIAGNOSIS — R0989 Other specified symptoms and signs involving the circulatory and respiratory systems: Secondary | ICD-10-CM | POA: Diagnosis not present

## 2014-12-07 DIAGNOSIS — I6523 Occlusion and stenosis of bilateral carotid arteries: Secondary | ICD-10-CM | POA: Insufficient documentation

## 2014-12-07 DIAGNOSIS — L089 Local infection of the skin and subcutaneous tissue, unspecified: Secondary | ICD-10-CM

## 2014-12-07 MED ORDER — AMOXICILLIN-POT CLAVULANATE 500-125 MG PO TABS: 1.0000 | ORAL_TABLET | Freq: Three times a day (TID) | ORAL | Status: DC

## 2014-12-07 NOTE — ED Notes (Signed)
States she had a problem w her right elbow area since Monday. Seen by her cardiologist this AM, and they suggested she come here to have her arm checked

## 2014-12-07 NOTE — ED Provider Notes (Signed)
CSN: [id number]     Arrival date & time 12/07/14  1120 History   First MD Initiated Contact with Patient 12/07/14 1323     Chief Complaint  Patient presents with  . Skin Problem   (Consider location/radiation/quality/duration/timing/severity/associated sxs/prior Treatment) HPI Comments: 69 year old female with COPD and CAD presents with a 63 day old abrasion to the right proximal forearm. She apparently tripped and fell and to a door by the dog. She received small skin tear. Over the ensuing days she has developed surrounding cutaneous erythema. She has been using antibiotic ointment and Band-Aids.   Past Medical History  Diagnosis Date  . COPD (chronic obstructive pulmonary disease)   . Cancer    Past Surgical History  Procedure Laterality Date  . Left heart catheterization with coronary angiogram N/A 10/30/2012    Procedure: LEFT HEART CATHETERIZATION WITH CORONARY ANGIOGRAM;  Surgeon: Clent Demark, MD;  Location: [facility] CATH LAB;  Service: Cardiovascular;  Laterality: N/A;   Family History  Problem Relation Age of Onset  . Emphysema Mother     was a smoker  . Rectal cancer Maternal Grandmother    History  Substance Use Topics  . Smoking status: Former Smoker -- 1.00 packs/day for 35 years    Types: Cigarettes    Quit date: 11/06/2012  . Smokeless tobacco: Never Used  . Alcohol Use: No   OB History    No data available     Review of Systems  Constitutional: Negative.  Negative for fever, activity change and fatigue.  HENT: Negative.   Respiratory: Negative.   Musculoskeletal: Negative.   Skin: Positive for wound. Negative for rash.    Allergies  Benadryl and Tylenol  Home Medications   Prior to Admission medications   Medication Sig Start Date End Date Taking? Authorizing Provider  albuterol (PROAIR HFA) 108 (90 BASE) MCG/ACT inhaler Inhale 2 puffs into the lungs every 4 (four) hours as needed for wheezing or shortness of breath.    Historical Provider, MD   amoxicillin-clavulanate (AUGMENTIN) 500-125 MG per tablet Take 1 tablet (500 mg total) by mouth every 8 (eight) hours. 12/07/14   Janne Napoleon, NP  aspirin EC 81 MG tablet Take 81 mg by mouth daily.    Historical Provider, MD  atorvastatin (LIPITOR) 20 MG tablet Take 20 mg by mouth daily at 6 PM. 11/06/12   Charolette Forward, MD  budesonide-formoterol (SYMBICORT) 160-4.5 MCG/ACT inhaler Take 2 puffs first thing in am and then another 2 puffs about 12 hours later. 03/02/14   Tanda Rockers, MD  docusate sodium (COLACE) 100 MG capsule Take 100 mg by mouth 2 (two) times daily.     Historical Provider, MD  nitroGLYCERIN (NITROSTAT) 0.4 MG SL tablet Place 0.4 mg under the tongue every 5 (five) minutes x 3 doses as needed for chest pain. 11/06/12   Charolette Forward, MD  polyethylene glycol (MIRALAX / GLYCOLAX) packet Take 17 g by mouth daily as needed.     Historical Provider, MD  predniSONE (DELTASONE) 10 MG tablet 4 tabs for 2 days, then 3 tabs for 2 days, 2 tabs for 2 days, then 1 tab for 2 days, then stop 09/27/14   Tammy S Parrett, NP   Pulse 75  Temp(Src) 98.9 F (37.2 C) (Oral)  Resp 16  SpO2 97% Physical Exam  Constitutional: She is oriented to person, place, and time. She appears well-developed and well-nourished. No distress.  Musculoskeletal: Normal range of motion. She exhibits no edema.  Neurological: She is  alert and oriented to person, place, and time.  Skin: Skin is warm and dry.  Superficial skin tear to the right proximal forearm near the elbow. This is healing by secondary intention. There is no oozing, draining or bleeding. There is surrounding cutaneous erythema and a small area of lymphangitis. Distal neurovascular motor sensory is intact.  Psychiatric: She has a normal mood and affect.  Nursing note and vitals reviewed.   ED Course  Procedures (including critical care time) Labs Review Labs Reviewed - No data to display  Imaging Review No results found.   MDM   1. Infected  skin tear   2. Cellulitis of right upper extremity    2. Clean and dry with soap and water Augmentin as directed For worsening, pain, or increased redness, drainage or pus, fever or other problems return properly.    Janne Napoleon, NP 12/07/14 1352

## 2014-12-07 NOTE — Progress Notes (Signed)
*  PRELIMINARY RESULTS* Vascular Ultrasound Carotid Duplex (Doppler) has been completed.  Preliminary findings: Bilateral:  1-39% ICA stenosis.  Vertebral artery flow is antegrade.      Landry Mellow, RDMS, RVT  12/07/2014, 11:10 AM

## 2014-12-07 NOTE — Discharge Instructions (Signed)
Cellulitis Cellulitis is an infection of the skin and the tissue under the skin. The infected area is usually red and tender. This happens most often in the arms and lower legs. HOME CARE   Take your antibiotic medicine as told. Finish the medicine even if you start to feel better.  Keep the infected arm or leg raised (elevated).  Put a warm cloth on the area up to 4 times per day.  Only take medicines as told by your doctor.  Keep all doctor visits as told. GET HELP IF:  You see red streaks on the skin coming from the infected area.  Your red area gets bigger or turns a dark color.  Your bone or joint under the infected area is painful after the skin heals.  Your infection comes back in the same area or different area.  You have a puffy (swollen) bump in the infected area.  You have new symptoms.  You have a fever. GET HELP RIGHT AWAY IF:   You feel very sleepy.  You throw up (vomit) or have watery poop (diarrhea).  You feel sick and have muscle aches and pains. MAKE SURE YOU:   Understand these instructions.  Will watch your condition.  Will get help right away if you are not doing well or get worse. Document Released: 02/26/2008 Document Revised: 01/24/2014 Document Reviewed: 11/25/2011 [facility] Patient Information 2015 [place], [place]. This information is not intended to replace advice given to you by your health care provider. Make sure you discuss any questions you have with your health care provider.  Non-Sutured Laceration A laceration is a cut or wound that goes through all layers of the skin and into the tissue just beneath the skin. Usually, these are stitched up or held together with tape or glue shortly after the injury occurred. However, if several or more hours have passed before getting care, too many germs (bacteria) get into the laceration. Stitching it closed would bring the risk of infection. If your health care provider feels your laceration is too  old, it may be left open and then bandaged to allow healing from the bottom layer up. HOME CARE INSTRUCTIONS   Change the bandage (dressing) 2 times a day or as directed by your health care provider.  If the dressing or packing gauze sticks, soak it off with soapy water.  When you re-bandage your laceration, make sure that the dressing or packing gauze goes all the way to the bottom of the laceration. The top of the laceration is kept open so it can heal from the bottom up. There is less chance for infection with this method.  Wash the area with soap and water 2 times a day to remove all the creams or ointments, if used. Rinse off the soap. Pat the area dry with a clean towel. Look for signs of infection, such as redness, swelling, or a red line that goes away from the laceration.  Re-apply creams or ointments if they were used to bandage the laceration. This helps keep the bandage from sticking.  If the bandage becomes wet, dirty, or has a bad smell, change it as soon as possible.  Only take medicine as directed by your health care provider. You might need a tetanus shot now if:  You have no idea when you had the last one.  You have never had a tetanus shot before.  Your laceration had dirt in it.  Your laceration was dirty, and your last tetanus shot was more  than 7 years ago.  Your laceration was clean, and your last tetanus shot was more than 10 years ago. If you need a tetanus shot, and you decide not to get one, there is a rare chance of getting tetanus. Sickness from tetanus can be serious. If you got a tetanus shot, your arm may swell and get red and warm to the touch at the shot site. This is common and not a problem. SEEK MEDICAL CARE IF:   You have redness, swelling, or increasing pain in the laceration.  You notice a red line that goes away from your laceration.  You have pus coming from the laceration.  You have a fever.  You notice a bad smell coming from the  laceration or dressing.  You notice something coming out of the laceration, such as wood or glass.  Your laceration is on your hand or foot and you are unable to properly move a finger or toe.  You have severe swelling around the laceration, causing pain and numbness.  You notice a change in color in your arm, hand, leg, or foot. MAKE SURE YOU:   Understand these instructions.  Will watch your condition.  Will get help right away if you are not doing well or get worse. Document Released: 08/07/2006 Document Revised: 09/14/2013 Document Reviewed: 02/27/2009 [facility] Patient Information 2015 [place], [place]. This information is not intended to replace advice given to you by your health care provider. Make sure you discuss any questions you have with your health care provider.

## 2015-01-23 ENCOUNTER — Encounter: Payer: Self-pay | Admitting: Internal Medicine

## 2015-01-23 ENCOUNTER — Ambulatory Visit (INDEPENDENT_AMBULATORY_CARE_PROVIDER_SITE_OTHER): Payer: Medicare Other | Admitting: Internal Medicine

## 2015-01-23 VITALS — BP 116/70 | HR 90 | Ht 65.0 in | Wt 132.8 lb

## 2015-01-23 DIAGNOSIS — J449 Chronic obstructive pulmonary disease, unspecified: Secondary | ICD-10-CM | POA: Diagnosis not present

## 2015-01-23 DIAGNOSIS — J9611 Chronic respiratory failure with hypoxia: Secondary | ICD-10-CM | POA: Diagnosis not present

## 2015-01-23 DIAGNOSIS — Z23 Encounter for immunization: Secondary | ICD-10-CM | POA: Diagnosis not present

## 2015-01-23 DIAGNOSIS — Z0000 Encounter for general adult medical examination without abnormal findings: Secondary | ICD-10-CM

## 2015-01-23 NOTE — Progress Notes (Signed)
Subjective:    Patient ID: Victoria Kennedy, female    DOB: [birth year]  MRN: [id number]    Brief patient profile:  6 yowf  With typical smoker's cough resolved after quit smoking 10/30/12 with MI and started on 02 and referred by [facility] for pulmonary evaluation with documented GOLD II COPD 01/14/13   History of Present Illness  DATE OF ADMISSION: 10/30/2012  DATE OF DISCHARGE: 11/06/2012  DISCHARGE SUMMARY  ADMITTING DIAGNOSES:  1. Acute coronary syndrome.  2. Bronchitis rule out pneumonia.  3. Chronic obstructive pulmonary disease.  4. Tobacco abuse.  5. Positive family history of coronary artery disease.  6. Osteopenia.  7. Degenerative joint disease.  FINAL DIAGNOSES:  1. Status post probable very small non-Q-wave myocardial infarction,  status post left catheterization.  2. Chronic obstructive pulmonary disease with mild bronchiectasis.  3. History of tobacco abuse.  4. Osteopenia.  5. Degenerative joint disease.  6. Malnutrition.  7. Deconditioning.  DISCHARGE HOME MEDICATIONS: Lovastatin 20 mg 1 tablet daily, Symbicort  160/4.5 mcg 2 puffs twice daily, feeding supplement 1 container 3 times  daily, levofloxacin 750 mg 1 tablet daily by mouth for 5 days,  metoprolol succinate 25 mg half tablet daily, Nitrostat 0.4 mg  sublingual use as directed, aspirin 81 mg 1 tablet daily, and O2 via  nasal cannula at 2 L/minute.   12/02/2012 1st pulmonary cc new pattern mostly dry cough and sense of post nasal drip comes and goes since d/c from hosp and started symbicort 160 2bid  And 02 at home at discharge. Limited by leg pains R not by breathing. Finished levaquin for productive cough one week prior to above admit and says this resolved. rec Change symbicort 160 2 every 12 hours if needed for your breathing but if your breathing is not limiting you from desired activities then you don't need it. Work on Doctor, hospital.    09/27/14 Acute OV  Complains of [facility] pt here for prod  cough with green mucus, increased SOB, tightness, wheezing x2 weeks.   Denies any f/c/s, n/v/d, hemoptysis.  Complains ear remain stopped up and very dry and itchy.  Using mucinex without much relief.  Rec Augmentin 875mg  Twice daily  For 7 days  Mucinex DM Twice daily   Prednisone taper over next week.  Follow back up with ENT for ears.     01/23/2015 f/u ov/Tishara Pizano re: GOLDII/ 02 2lpm np and symb 160 2bid  Chief Complaint  Patient presents with  . Follow-up    Pt c/o non-productive cough x 3 days since running A/C at home.Denies chest pain, congestion or wheezing    avg saba qod / no noct events    doe MMRC 1  On 02  = her baseline   No obvious day to day or daytime variabilty or assoc excess or purulent sputum or cp or chest tightness, subjective wheeze overt sinus or hb symptoms. No unusual exp hx or h/o childhood pna/ asthma or knowledge of premature birth.  Sleeping ok without nocturnal  or early am exacerbation  of respiratory  c/o's or need for noct saba. Also denies any obvious fluctuation of symptoms with weather or environmental changes or other aggravating or alleviating factors except as outlined above   Current Medications, Allergies, Complete Past Medical History, Past Surgical History, Family History, and Social History were reviewed in [employer] record.  ROS  The following are not active complaints unless bolded sore throat, dysphagia, dental problems, itching, sneezing,  nasal  congestion or excess/ purulent secretions, ear ache,   fever, chills, sweats, unintended wt loss, pleuritic or exertional cp, hemoptysis,  orthopnea pnd or leg swelling, presyncope, palpitations, heartburn, abdominal pain, anorexia, nausea, vomiting, diarrhea  or change in bowel or urinary habits, change in stools or urine, dysuria,hematuria,  rash, arthralgias, visual complaints, headache, numbness weakness or ataxia or problems with walking or coordination,  change in  mood/affect or memory.            Objective:   Physical Exam  Thin amb wf unusual affect  04/22/2013       112 > 07/30/2013  121 > 03/02/2014 127 >136 09/27/14 > 01/23/2015   132   HEENT mild turbinate edema.  Oropharynx no thrush or excess pnd or cobblestoning.EAC dry scaly skin  No JVD or cervical adenopathy. Mild accessory muscle hypertrophy. Trachea midline, nl thryroid. Chest was hyperinflated by percussion with diminished breath sounds bilaterally. Hoover sign positive at mid inspiration. Regular rate and rhythm without murmur gallop or rub or increase P2 or edema.  Abd: no hsm, nl excursion. Ext warm without cyanosis or clubbing.       CT  10/30/12  Chronic lung disease with mild bronchiectasis in the right upper  lobe. No evidence of acute pulmonary embolism or focal pulmonary  Consolidation    cxr 03/02/14 The heart size and mediastinal contours are within normal limits. The lungs are hyperinflated. No focal regions of consolidation or focal infiltrates. Minimal areas of linear density within the lung bases. No acute osseous abnormalities.       Assessment & Plan:

## 2015-01-23 NOTE — Patient Instructions (Addendum)
Prevnar 13 today   No change in meds needed   Please see patient coordinator before you leave today  to schedule for portable 02 alternatives and referral to  Primary care   Please schedule a follow up visit in 3 months but call sooner if needed to see Tammy NP on return

## 2015-01-24 ENCOUNTER — Encounter: Payer: Self-pay | Admitting: Internal Medicine

## 2015-01-24 NOTE — Assessment & Plan Note (Signed)
-   Quit smoking Feb 2014 - PFT's 01/14/2013 FEV1  0.70 (40%) with ratio 44 and 55% better  p B2 wth dlco 42% corrects to 60   The proper method of use, as well as anticipated side effects, of a metered-dose inhaler are discussed and demonstrated to the patient. Improved effectiveness after extensive coaching during this visit to a level of approximately      90%  I had an extended discussion with the patient reviewing all relevant studies completed to date and  lasting 15 to 20 minutes of a 25 minute visit on the following ongoing concerns:  1) since she has such a significant reversible component she is more of an ACOS pt and symbicort is fine for now, could add lama later if MMRC fxn worsens or saba use increases on symbicort    2) 02 issues addressed separately  3) Each maintenance medication was reviewed in detail including most importantly the difference between maintenance and as needed and under what circumstances the prns are to be used.  Please see instructions for details which were reviewed in writing and the patient given a copy.

## 2015-01-24 NOTE — Assessment & Plan Note (Signed)
-   04/21/2013  Walked RA x 1 laps @ 185 ft each stopped due to  sats 86% > corrected on 2lpm - 01/23/2015 referral for POC eval

## 2015-01-24 NOTE — Assessment & Plan Note (Signed)
Needs primary care doctor/ referred to [facility]

## 2015-01-27 ENCOUNTER — Telehealth: Payer: Self-pay | Admitting: Internal Medicine

## 2015-01-27 NOTE — Telephone Encounter (Signed)
Left patient a vm to call back to establish care with [facility]

## 2015-01-27 NOTE — Telephone Encounter (Signed)
Spoke with pt. Advised her it looks like primary care was trying to get in touch with her (there is a telephone encounter). Gave her the phone number to contact them. Nothing further was needed.

## 2015-03-17 ENCOUNTER — Ambulatory Visit (INDEPENDENT_AMBULATORY_CARE_PROVIDER_SITE_OTHER): Payer: Medicare Other | Admitting: Internal Medicine

## 2015-03-17 ENCOUNTER — Encounter: Payer: Self-pay | Admitting: Internal Medicine

## 2015-03-17 VITALS — BP 124/66 | HR 75 | Temp 98.4°F | Resp 16 | Ht 59.0 in | Wt 131.0 lb

## 2015-03-17 DIAGNOSIS — J449 Chronic obstructive pulmonary disease, unspecified: Secondary | ICD-10-CM

## 2015-03-17 DIAGNOSIS — Z780 Asymptomatic menopausal state: Secondary | ICD-10-CM | POA: Diagnosis not present

## 2015-03-17 DIAGNOSIS — R21 Rash and other nonspecific skin eruption: Secondary | ICD-10-CM | POA: Diagnosis not present

## 2015-03-17 MED ORDER — VALACYCLOVIR HCL 500 MG PO TABS: 500.0000 mg | ORAL_TABLET | Freq: Two times a day (BID) | ORAL | Status: AC

## 2015-03-17 NOTE — Progress Notes (Signed)
Pre visit review using our clinic review tool, if applicable. No additional management support is needed unless otherwise documented below in the visit note. 

## 2015-03-17 NOTE — Progress Notes (Signed)
   Subjective:    Patient ID: Victoria Kennedy, female    DOB: 1946-06-30, 69 y.o.   MRN: [id number]  HPI The patient is a 69 YO female coming in for rash on her finger. It has been present for many months with worsening. Her other doctor gave her keflex for it which she recently finished. It did not help at all. She has tried neosporin which seemed to worsen it. Does not drain out stuff or have clear blisters although there is some skin breakdown. Has never had before it started.   PMH, [facility], social history reviewed and updated.   Review of Systems  Constitutional: Negative for fever, activity change, appetite change, fatigue and unexpected weight change.  HENT: Negative.   Eyes: Negative.   Respiratory: Positive for shortness of breath. Negative for cough, chest tightness and wheezing.   Cardiovascular: Negative for chest pain, palpitations and leg swelling.  Gastrointestinal: Negative for abdominal pain, diarrhea, constipation and abdominal distention.  Musculoskeletal: Positive for arthralgias. Negative for myalgias, back pain and neck pain.  Skin: Positive for color change and rash.  Neurological: Negative for dizziness, speech difficulty, weakness, light-headedness and headaches.  Psychiatric/Behavioral: Negative.       Objective:   Physical Exam  Constitutional: She is oriented to person, place, and time. She appears well-developed and well-nourished.  HENT:  Head: Normocephalic and atraumatic.  Right Ear: External ear normal.  Left Ear: External ear normal.  Eyes: EOM are normal.  Neck: Normal range of motion. No JVD present. No thyromegaly present.  Cardiovascular: Normal rate and regular rhythm.   Pulmonary/Chest: Effort normal. She has wheezes.  Diffuse mild wheezing, no focal rales or rhonchi  Abdominal: Soft. She exhibits no distension. There is no tenderness. There is no rebound.  Musculoskeletal: She exhibits no edema.  Lymphadenopathy:    She has no cervical  adenopathy.  Neurological: She is alert and oriented to person, place, and time. Coordination normal.  Skin: Skin is warm and dry. Rash noted.  Rash on the 3rd finger right hand, with rash below the nail to PIP with some skin breakdown, no clear blistering.    Filed Vitals:   03/17/15 1445  BP: 124/66  Pulse: 75  Temp: 98.4 F (36.9 C)  TempSrc: Oral  Resp: 16  Height: 4\' 11"  (1.499 m)  Weight: 131 lb (59.421 kg)  SpO2: 97%      Assessment & Plan:

## 2015-03-17 NOTE — Patient Instructions (Addendum)
We have sent in a medicine that should help the finger called valacyclovir. Take 1 pill twice a day for 2 weeks. If no better then call us back and we will have you see a dermatologist.   We will have you get a bone density scan to make sure the bones are still good.   Health Maintenance Adopting a healthy lifestyle and getting preventive care can go a long way to promote health and wellness. Talk with your health care provider about what schedule of regular examinations is right for you. This is a good chance for you to check in with your provider about disease prevention and staying healthy. In between checkups, there are plenty of things you can do on your own. Experts have done a lot of research about which lifestyle changes and preventive measures are most likely to keep you healthy. Ask your health care provider for more information. WEIGHT AND DIET  Eat a healthy diet  Be sure to include plenty of vegetables, fruits, low-fat dairy products, and lean protein.  Do not eat a lot of foods high in solid fats, added sugars, or salt.  Get regular exercise. This is one of the most important things you can do for your health.  Most adults should exercise for at least 150 minutes each week. The exercise should increase your heart rate and make you sweat (moderate-intensity exercise).  Most adults should also do strengthening exercises at least twice a week. This is in addition to the moderate-intensity exercise.  Maintain a healthy weight  Body mass index (BMI) is a measurement that can be used to identify possible weight problems. It estimates body fat based on height and weight. Your health care provider can help determine your BMI and help you achieve or maintain a healthy weight.  For females 69 years of age and older:   A BMI below 18.5 is considered underweight.  A BMI of 18.5 to 24.9 is normal.  A BMI of 25 to 29.9 is considered overweight.  A BMI of 30 and above is considered  obese.  Watch levels of cholesterol and blood lipids  You should start having your blood tested for lipids and cholesterol at 69 years of age, then have this test every 5 years.  You may need to have your cholesterol levels checked more often if:  Your lipid or cholesterol levels are high.  You are older than 69 years of age.  You are at high risk for heart disease.  CANCER SCREENING   Lung Cancer  Lung cancer screening is recommended for adults 69-49 years old who are at high risk for lung cancer because of a history of smoking.  A yearly low-dose CT scan of the lungs is recommended for people who:  Currently smoke.  Have quit within the past 15 years.  Have at least a 30-pack-year history of smoking. A pack year is smoking an average of one pack of cigarettes a day for 1 year.  Yearly screening should continue until it has been 15 years since you quit.  Yearly screening should stop if you develop a health problem that would prevent you from having lung cancer treatment.  Breast Cancer  Practice breast self-awareness. This means understanding how your breasts normally appear and feel.  It also means doing regular breast self-exams. Let your health care provider know about any changes, no matter how small.  If you are in your 20s or 30s, you should have a clinical breast exam (CBE) by  a health care provider every 1-3 years as part of a regular health exam.  If you are 40 or older, have a CBE every year. Also consider having a breast X-ray (mammogram) every year.  If you have a family history of breast cancer, talk to your health care provider about genetic screening.  If you are at high risk for breast cancer, talk to your health care provider about having an MRI and a mammogram every year.  Breast cancer gene (BRCA) assessment is recommended for women who have family members with BRCA-related cancers. BRCA-related cancers  include:  Breast.  Ovarian.  Tubal.  Peritoneal cancers.  Results of the assessment will determine the need for genetic counseling and BRCA1 and BRCA2 testing. Cervical Cancer Routine pelvic examinations to screen for cervical cancer are no longer recommended for nonpregnant women who are considered low risk for cancer of the pelvic organs (ovaries, uterus, and vagina) and who do not have symptoms. A pelvic examination may be necessary if you have symptoms including those associated with pelvic infections. Ask your health care provider if a screening pelvic exam is right for you.   The Pap test is the screening test for cervical cancer for women who are considered at risk.  If you had a hysterectomy for a problem that was not cancer or a condition that could lead to cancer, then you no longer need Pap tests.  If you are older than 65 years, and you have had normal Pap tests for the past 10 years, you no longer need to have Pap tests.  If you have had past treatment for cervical cancer or a condition that could lead to cancer, you need Pap tests and screening for cancer for at least 20 years after your treatment.  If you no longer get a Pap test, assess your risk factors if they change (such as having a new sexual partner). This can affect whether you should start being screened again.  Some women have medical problems that increase their chance of getting cervical cancer. If this is the case for you, your health care provider may recommend more frequent screening and Pap tests.  The human papillomavirus (HPV) test is another test that may be used for cervical cancer screening. The HPV test looks for the virus that can cause cell changes in the cervix. The cells collected during the Pap test can be tested for HPV.  The HPV test can be used to screen women 30 years of age and older. Getting tested for HPV can extend the interval between normal Pap tests from three to five years.  An HPV  test also should be used to screen women of any age who have unclear Pap test results.  After 69 years of age, women should have HPV testing as often as Pap tests.  Colorectal Cancer  This type of cancer can be detected and often prevented.  Routine colorectal cancer screening usually begins at 69 years of age and continues through 69 years of age.  Your health care provider may recommend screening at an earlier age if you have risk factors for colon cancer.  Your health care provider may also recommend using home test kits to check for hidden blood in the stool.  A small camera at the end of a tube can be used to examine your colon directly (sigmoidoscopy or colonoscopy). This is done to check for the earliest forms of colorectal cancer.  Routine screening usually begins at age 50.  Direct examination   of the colon should be repeated every 5-10 years through 69 years of age. However, you may need to be screened more often if early forms of precancerous polyps or small growths are found. Skin Cancer  Check your skin from head to toe regularly.  Tell your health care provider about any new moles or changes in moles, especially if there is a change in a mole's shape or color.  Also tell your health care provider if you have a mole that is larger than the size of a pencil eraser.  Always use sunscreen. Apply sunscreen liberally and repeatedly throughout the day.  Protect yourself by wearing long sleeves, pants, a wide-brimmed hat, and sunglasses whenever you are outside. HEART DISEASE, DIABETES, AND HIGH BLOOD PRESSURE   Have your blood pressure checked at least every 1-2 years. High blood pressure causes heart disease and increases the risk of stroke.  If you are between 55 years and 79 years old, ask your health care provider if you should take aspirin to prevent strokes.  Have regular diabetes screenings. This involves taking a blood sample to check your fasting blood sugar  level.  If you are at a normal weight and have a low risk for diabetes, have this test once every three years after 69 years of age.  If you are overweight and have a high risk for diabetes, consider being tested at a younger age or more often. PREVENTING INFECTION  Hepatitis B  If you have a higher risk for hepatitis B, you should be screened for this virus. You are considered at high risk for hepatitis B if:  You were born in a country where hepatitis B is common. Ask your health care provider which countries are considered high risk.  Your parents were born in a high-risk country, and you have not been immunized against hepatitis B (hepatitis B vaccine).  You have HIV or AIDS.  You use needles to inject street drugs.  You live with someone who has hepatitis B.  You have had sex with someone who has hepatitis B.  You get hemodialysis treatment.  You take certain medicines for conditions, including cancer, organ transplantation, and autoimmune conditions. Hepatitis C  Blood testing is recommended for:  Everyone born from 1945 through 1965.  Anyone with known risk factors for hepatitis C. Sexually transmitted infections (STIs)  You should be screened for sexually transmitted infections (STIs) including gonorrhea and chlamydia if:  You are sexually active and are younger than 69 years of age.  You are older than 69 years of age and your health care provider tells you that you are at risk for this type of infection.  Your sexual activity has changed since you were last screened and you are at an increased risk for chlamydia or gonorrhea. Ask your health care provider if you are at risk.  If you do not have HIV, but are at risk, it may be recommended that you take a prescription medicine daily to prevent HIV infection. This is called pre-exposure prophylaxis (PrEP). You are considered at risk if:  You are sexually active and do not regularly use condoms or know the HIV status  of your partner(s).  You take drugs by injection.  You are sexually active with a partner who has HIV. Talk with your health care provider about whether you are at high risk of being infected with HIV. If you choose to begin PrEP, you should first be tested for HIV. You should then be tested every   3 months for as long as you are taking PrEP.  PREGNANCY   If you are premenopausal and you may become pregnant, ask your health care provider about preconception counseling.  If you may become pregnant, take 400 to 800 micrograms (mcg) of folic acid every day.  If you want to prevent pregnancy, talk to your health care provider about birth control (contraception). OSTEOPOROSIS AND MENOPAUSE   Osteoporosis is a disease in which the bones lose minerals and strength with aging. This can result in serious bone fractures. Your risk for osteoporosis can be identified using a bone density scan.  If you are 10 years of age or older, or if you are at risk for osteoporosis and fractures, ask your health care provider if you should be screened.  Ask your health care provider whether you should take a calcium or vitamin D supplement to lower your risk for osteoporosis.  Menopause may have certain physical symptoms and risks.  Hormone replacement therapy may reduce some of these symptoms and risks. Talk to your health care provider about whether hormone replacement therapy is right for you.  HOME CARE INSTRUCTIONS   Schedule regular health, dental, and eye exams.  Stay current with your immunizations.   Do not use any tobacco products including cigarettes, chewing tobacco, or electronic cigarettes.  If you are pregnant, do not drink alcohol.  If you are breastfeeding, limit how much and how often you drink alcohol.  Limit alcohol intake to no more than 1 drink per day for nonpregnant women. One drink equals 12 ounces of beer, 5 ounces of wine, or 1 ounces of hard liquor.  Do not use street  drugs.  Do not share needles.  Ask your health care provider for help if you need support or information about quitting drugs.  Tell your health care provider if you often feel depressed.  Tell your health care provider if you have ever been abused or do not feel safe at home. Document Released: 03/25/2011 Document Revised: 01/24/2014 Document Reviewed: 08/11/2013 [facility] Patient Information 2015 [place], [place]. This information is not intended to replace advice given to you by your health care provider. Make sure you discuss any questions you have with your health care provider.

## 2015-03-17 NOTE — Assessment & Plan Note (Signed)
Has not improved with antibiotics and concern that it could be herpetic whitlow. Will treat with valacyclovir and if no improvement needs referral to dermatology.

## 2015-03-17 NOTE — Assessment & Plan Note (Signed)
Not smoking any longer, follows with pulmonary for her COPD. On chronic oxygen therapy. Does okay with ADLs and limited movement by some SOB and hard to get her oxygen around although she has the portable unit. No flare today and no indication for antibiotics or steroids.

## 2015-03-23 ENCOUNTER — Ambulatory Visit
Admission: RE | Admit: 2015-03-23 | Discharge: 2015-03-23 | Disposition: A | Discharge status: Home / Self Care | Payer: Medicare Other | Source: Ambulatory Visit

## 2015-03-23 DIAGNOSIS — Z780 Asymptomatic menopausal state: Secondary | ICD-10-CM

## 2015-03-24 ENCOUNTER — Ambulatory Visit (INDEPENDENT_AMBULATORY_CARE_PROVIDER_SITE_OTHER)
Admission: RE | Admit: 2015-03-24 | Discharge: 2015-03-24 | Disposition: A | Discharge status: Home / Self Care | Payer: Medicare Other | Source: Ambulatory Visit | Attending: Internal Medicine | Admitting: Internal Medicine

## 2015-03-24 DIAGNOSIS — Z780 Asymptomatic menopausal state: Secondary | ICD-10-CM

## 2015-05-07 DIAGNOSIS — J449 Chronic obstructive pulmonary disease, unspecified: Secondary | ICD-10-CM | POA: Diagnosis not present

## 2015-05-16 ENCOUNTER — Telehealth: Payer: Self-pay | Admitting: Internal Medicine

## 2015-05-16 DIAGNOSIS — L989 Disorder of the skin and subcutaneous tissue, unspecified: Secondary | ICD-10-CM

## 2015-05-16 NOTE — Telephone Encounter (Signed)
Pt called in said that spot on her finger has not cleared up.  She is needing a referral   Best number [phone]

## 2015-05-17 NOTE — Telephone Encounter (Signed)
Referral placed.

## 2015-05-24 DIAGNOSIS — L309 Dermatitis, unspecified: Secondary | ICD-10-CM | POA: Diagnosis not present

## 2015-06-07 DIAGNOSIS — I2510 Atherosclerotic heart disease of native coronary artery without angina pectoris: Secondary | ICD-10-CM | POA: Diagnosis not present

## 2015-06-07 DIAGNOSIS — E785 Hyperlipidemia, unspecified: Secondary | ICD-10-CM | POA: Diagnosis not present

## 2015-06-07 DIAGNOSIS — J449 Chronic obstructive pulmonary disease, unspecified: Secondary | ICD-10-CM | POA: Diagnosis not present

## 2015-06-07 DIAGNOSIS — I252 Old myocardial infarction: Secondary | ICD-10-CM | POA: Diagnosis not present

## 2015-06-07 DIAGNOSIS — E559 Vitamin D deficiency, unspecified: Secondary | ICD-10-CM | POA: Diagnosis not present

## 2015-07-07 DIAGNOSIS — J449 Chronic obstructive pulmonary disease, unspecified: Secondary | ICD-10-CM | POA: Diagnosis not present

## 2015-08-07 DIAGNOSIS — J449 Chronic obstructive pulmonary disease, unspecified: Secondary | ICD-10-CM | POA: Diagnosis not present

## 2015-09-04 DIAGNOSIS — I10 Essential (primary) hypertension: Secondary | ICD-10-CM | POA: Diagnosis not present

## 2015-09-04 DIAGNOSIS — E785 Hyperlipidemia, unspecified: Secondary | ICD-10-CM | POA: Diagnosis not present

## 2015-09-06 DIAGNOSIS — J449 Chronic obstructive pulmonary disease, unspecified: Secondary | ICD-10-CM | POA: Diagnosis not present

## 2015-09-08 ENCOUNTER — Ambulatory Visit: Payer: Medicare Other | Admitting: Internal Medicine

## 2015-09-08 DIAGNOSIS — I2510 Atherosclerotic heart disease of native coronary artery without angina pectoris: Secondary | ICD-10-CM | POA: Diagnosis not present

## 2015-09-08 DIAGNOSIS — E785 Hyperlipidemia, unspecified: Secondary | ICD-10-CM | POA: Diagnosis not present

## 2015-09-08 DIAGNOSIS — J449 Chronic obstructive pulmonary disease, unspecified: Secondary | ICD-10-CM | POA: Diagnosis not present

## 2015-09-08 DIAGNOSIS — E559 Vitamin D deficiency, unspecified: Secondary | ICD-10-CM | POA: Diagnosis not present

## 2015-09-08 DIAGNOSIS — I252 Old myocardial infarction: Secondary | ICD-10-CM | POA: Diagnosis not present

## 2015-09-21 ENCOUNTER — Ambulatory Visit (INDEPENDENT_AMBULATORY_CARE_PROVIDER_SITE_OTHER): Payer: Medicare Other | Admitting: Internal Medicine

## 2015-09-21 ENCOUNTER — Encounter: Payer: Self-pay | Admitting: Internal Medicine

## 2015-09-21 VITALS — BP 140/68 | HR 71 | Ht 60.0 in | Wt 129.0 lb

## 2015-09-21 DIAGNOSIS — Z0000 Encounter for general adult medical examination without abnormal findings: Secondary | ICD-10-CM | POA: Diagnosis not present

## 2015-09-21 NOTE — Progress Notes (Signed)
Pre visit review using our clinic review tool, if applicable. No additional management support is needed unless otherwise documented below in the visit note. 

## 2015-09-21 NOTE — Patient Instructions (Signed)
We are not changing your medicines and no blood work today.

## 2015-09-22 NOTE — Progress Notes (Signed)
   Subjective:    Patient ID: Victoria Kennedy, female    DOB: [birth year], 69 y.o.   MRN: [id number]  HPI Here for medicare wellness, no new complaints. Please see A/P for status and treatment of chronic medical problems.   Diet: heart healthy Physical activity: sedentary Depression/mood screen: negative Hearing: decreased Visual acuity: grossly normal, performs annual eye exam  ADLs: capable Fall risk: none Home safety: good Cognitive evaluation: intact to orientation, naming, recall and repetition EOL planning: adv directives discussed  I have personally reviewed and have noted 1. The patient's medical and social history - reviewed today no changes 2. Their use of alcohol, tobacco or illicit drugs 3. Their current medications and supplements 4. The patient's functional ability including ADL's, fall risks, home safety risks and hearing or visual impairment. 5. Diet and physical activities 6. Evidence for depression or mood disorders 7. Care team reviewed and updated (available in snapshot)  Review of Systems  Constitutional: Negative for fever, activity change, appetite change, fatigue and unexpected weight change.  HENT: Negative.   Eyes: Negative.   Respiratory: Negative for cough, chest tightness, shortness of breath and wheezing.   Cardiovascular: Negative for chest pain, palpitations and leg swelling.  Gastrointestinal: Negative for abdominal pain, diarrhea, constipation and abdominal distention.  Musculoskeletal: Positive for arthralgias. Negative for myalgias, back pain and neck pain.  Skin: Negative for color change and rash.  Neurological: Negative for dizziness, speech difficulty, weakness, light-headedness and headaches.  Psychiatric/Behavioral: Negative.       Objective:   Physical Exam  Constitutional: She is oriented to person, place, and time. She appears well-developed and well-nourished.  HENT:  Head: Normocephalic and atraumatic.  Right Ear: External ear  normal.  Left Ear: External ear normal.  Eyes: EOM are normal.  Neck: Normal range of motion. No JVD present. No thyromegaly present.  Cardiovascular: Normal rate and regular rhythm.   Pulmonary/Chest: Effort normal and breath sounds normal. No respiratory distress. She has no wheezes. She has no rales.  Abdominal: Soft. She exhibits no distension. There is no tenderness. There is no rebound.  Musculoskeletal: She exhibits no edema.  Lymphadenopathy:    She has no cervical adenopathy.  Neurological: She is alert and oriented to person, place, and time. Coordination normal.  Skin: Skin is warm and dry.   Filed Vitals:   09/21/15 1347  BP: 140/68  Pulse: 71  Weight: 129 lb (58.514 kg)  SpO2: 94%      Assessment & Plan:

## 2015-09-22 NOTE — Assessment & Plan Note (Signed)
Colonoscopy up to date and mammogram needed. Labs reviewed with her at visit. Declines flu shot and has not had shingles shot. Counseled about sun safety with sunscreen. Given 10 year screening recommendations.

## 2015-10-07 DIAGNOSIS — J449 Chronic obstructive pulmonary disease, unspecified: Secondary | ICD-10-CM | POA: Diagnosis not present

## 2015-11-07 DIAGNOSIS — J449 Chronic obstructive pulmonary disease, unspecified: Secondary | ICD-10-CM | POA: Diagnosis not present

## 2015-11-20 ENCOUNTER — Telehealth: Payer: Self-pay | Admitting: Internal Medicine

## 2015-11-20 MED ORDER — AZITHROMYCIN 250 MG PO TABS: ORAL_TABLET | ORAL | Status: AC

## 2015-11-20 NOTE — Telephone Encounter (Signed)
Pt c/o sinus congestion, PND, prod cough with green/gray mucus, body aches, chills, low grade temp intermittently X2 days.   Pt taking cough drops and tylenol to help with s/s. Pt uses [facility] on [address].  MW please advise.  Thanks!   Patient Instructions       Prevnar 13 today   No change in meds needed   Please see patient coordinator before you leave today  to schedule for portable 02 alternatives and referral to [facility]   Please schedule a follow up visit in 3 months but call sooner if needed to see Tammy NP on return

## 2015-11-20 NOTE — Telephone Encounter (Signed)
Ov for rx - if refuses ok to offer zpak only

## 2015-11-20 NOTE — Telephone Encounter (Signed)
Spoke with pt, requesting zpak first.  rx sent to pharmacy.  Nothing further needed.

## 2015-12-01 DIAGNOSIS — E559 Vitamin D deficiency, unspecified: Secondary | ICD-10-CM | POA: Diagnosis not present

## 2015-12-01 DIAGNOSIS — E785 Hyperlipidemia, unspecified: Secondary | ICD-10-CM | POA: Diagnosis not present

## 2015-12-01 DIAGNOSIS — I252 Old myocardial infarction: Secondary | ICD-10-CM | POA: Diagnosis not present

## 2015-12-01 DIAGNOSIS — J449 Chronic obstructive pulmonary disease, unspecified: Secondary | ICD-10-CM | POA: Diagnosis not present

## 2015-12-01 DIAGNOSIS — I2510 Atherosclerotic heart disease of native coronary artery without angina pectoris: Secondary | ICD-10-CM | POA: Diagnosis not present

## 2015-12-05 DIAGNOSIS — J449 Chronic obstructive pulmonary disease, unspecified: Secondary | ICD-10-CM | POA: Diagnosis not present

## 2016-01-05 DIAGNOSIS — J449 Chronic obstructive pulmonary disease, unspecified: Secondary | ICD-10-CM | POA: Diagnosis not present

## 2016-02-04 DIAGNOSIS — J449 Chronic obstructive pulmonary disease, unspecified: Secondary | ICD-10-CM | POA: Diagnosis not present

## 2016-03-06 DIAGNOSIS — J449 Chronic obstructive pulmonary disease, unspecified: Secondary | ICD-10-CM | POA: Diagnosis not present

## 2016-03-15 DIAGNOSIS — J449 Chronic obstructive pulmonary disease, unspecified: Secondary | ICD-10-CM | POA: Diagnosis not present

## 2016-03-15 DIAGNOSIS — I2510 Atherosclerotic heart disease of native coronary artery without angina pectoris: Secondary | ICD-10-CM | POA: Diagnosis not present

## 2016-03-15 DIAGNOSIS — I10 Essential (primary) hypertension: Secondary | ICD-10-CM | POA: Diagnosis not present

## 2016-03-15 DIAGNOSIS — E785 Hyperlipidemia, unspecified: Secondary | ICD-10-CM | POA: Diagnosis not present

## 2016-03-15 DIAGNOSIS — I252 Old myocardial infarction: Secondary | ICD-10-CM | POA: Diagnosis not present

## 2016-03-21 ENCOUNTER — Ambulatory Visit: Payer: Medicare Other | Admitting: Internal Medicine

## 2016-04-05 DIAGNOSIS — J449 Chronic obstructive pulmonary disease, unspecified: Secondary | ICD-10-CM | POA: Diagnosis not present

## 2016-05-06 DIAGNOSIS — J449 Chronic obstructive pulmonary disease, unspecified: Secondary | ICD-10-CM | POA: Diagnosis not present

## 2016-05-14 IMAGING — CR DG CHEST 2V
2 series · 2 of 2 positions shown · non-contrast
Comparison: Abdominal series dated 11/30/2012

CLINICAL DATA: cough

EXAM:
CHEST  2 VIEW

[view not recorded (1 of 2)]
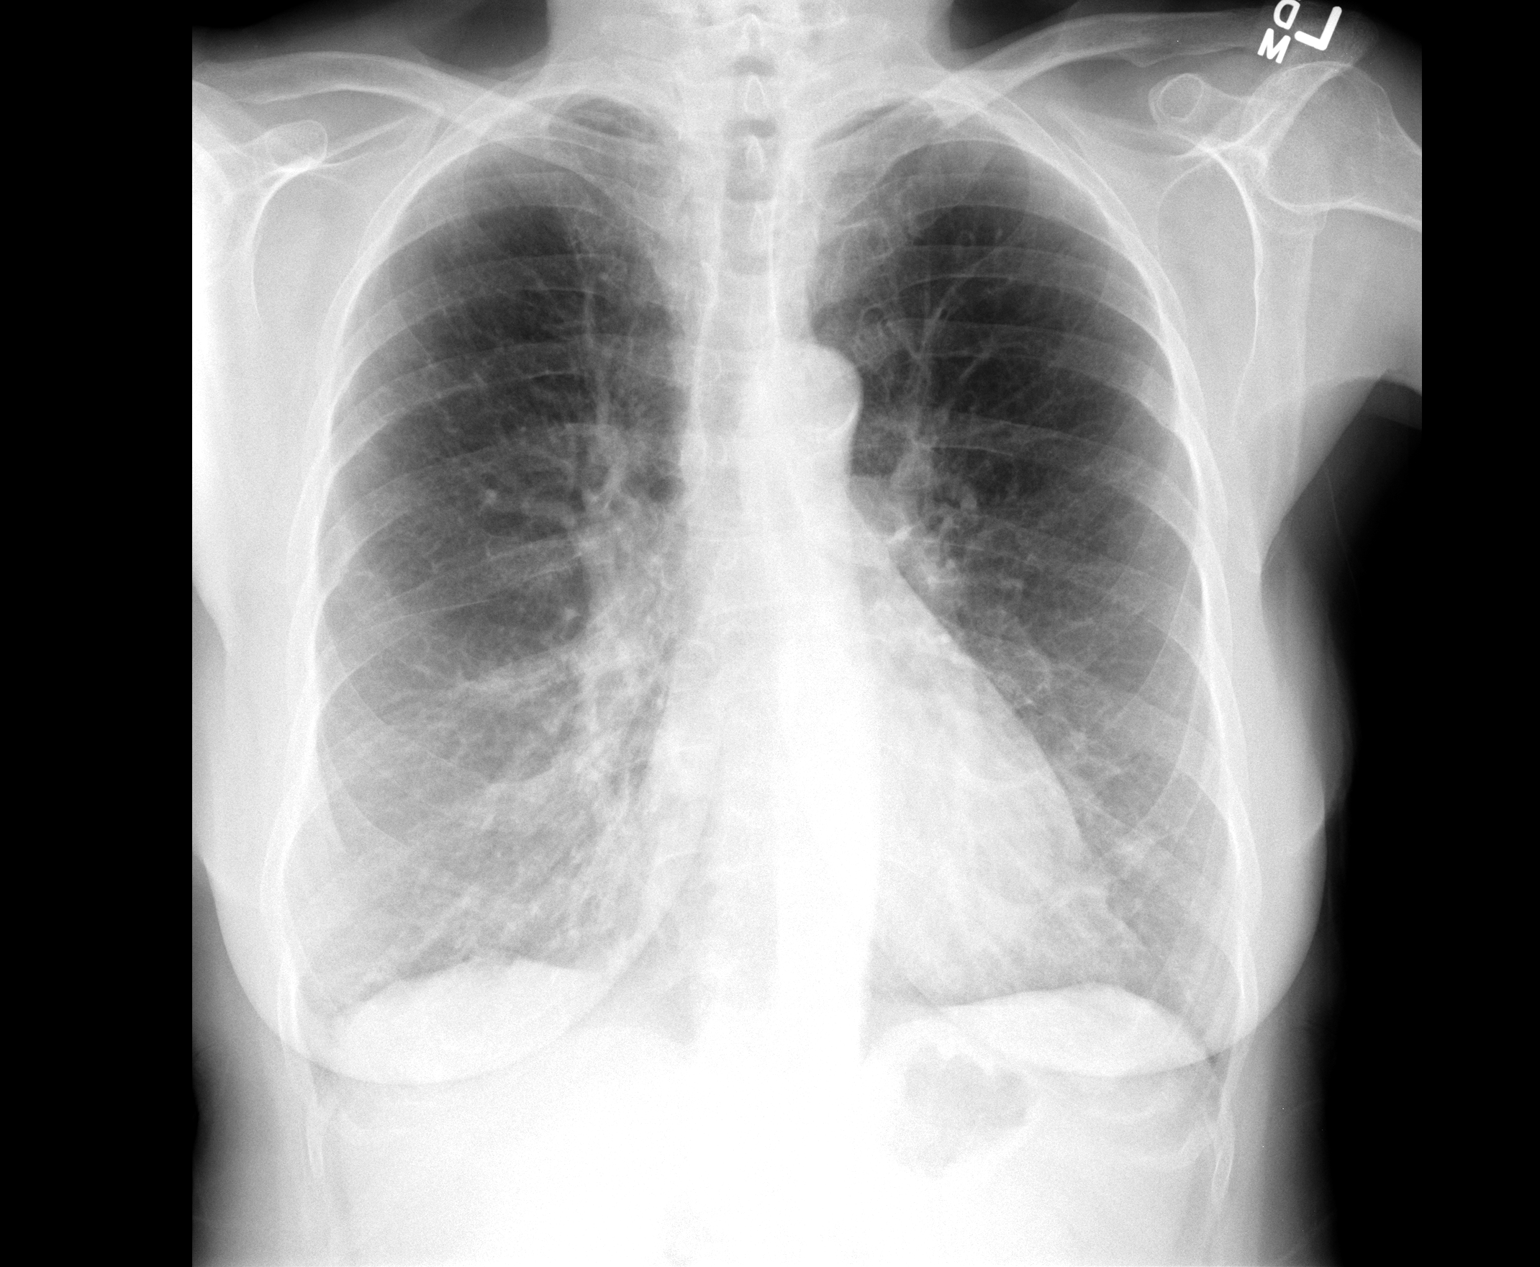

[view not recorded (2 of 2)]
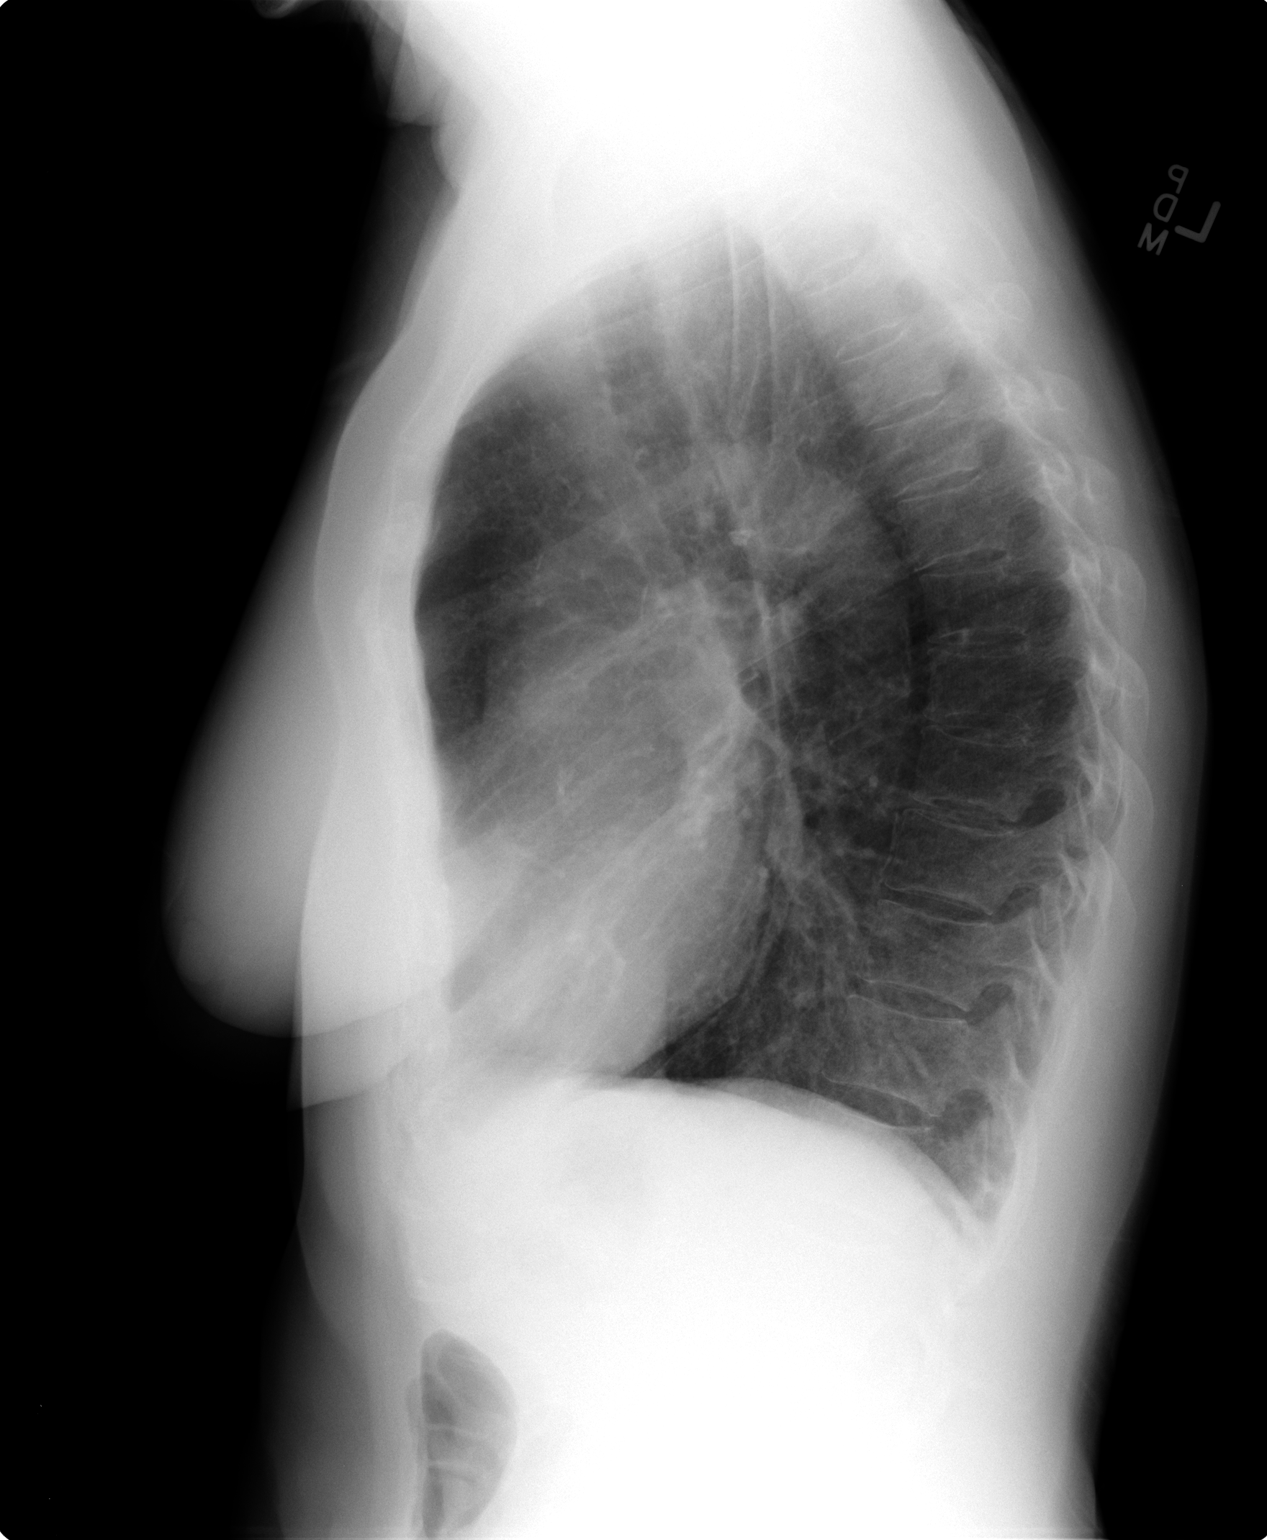

[2 of 2 positions shown; findings below may reference images not displayed]

FINDINGS: The heart size and mediastinal contours are within normal limits.
The lungs are hyperinflated. No focal regions of consolidation or
focal infiltrates. Minimal areas of linear density within the lung
bases. No acute osseous abnormalities.
IMPRESSION: COPD.

Minimal scarring versus atelectasis within the lung bases.

No focal regions of consolidation or focal infiltrates.

## 2016-06-06 DIAGNOSIS — J449 Chronic obstructive pulmonary disease, unspecified: Secondary | ICD-10-CM | POA: Diagnosis not present

## 2016-06-13 ENCOUNTER — Encounter: Payer: Self-pay | Admitting: Internal Medicine

## 2016-06-14 DIAGNOSIS — E785 Hyperlipidemia, unspecified: Secondary | ICD-10-CM | POA: Diagnosis not present

## 2016-06-14 DIAGNOSIS — I2510 Atherosclerotic heart disease of native coronary artery without angina pectoris: Secondary | ICD-10-CM | POA: Diagnosis not present

## 2016-06-14 DIAGNOSIS — J449 Chronic obstructive pulmonary disease, unspecified: Secondary | ICD-10-CM | POA: Diagnosis not present

## 2016-06-14 DIAGNOSIS — I252 Old myocardial infarction: Secondary | ICD-10-CM | POA: Diagnosis not present

## 2016-07-06 DIAGNOSIS — J449 Chronic obstructive pulmonary disease, unspecified: Secondary | ICD-10-CM | POA: Diagnosis not present

## 2016-08-06 DIAGNOSIS — J449 Chronic obstructive pulmonary disease, unspecified: Secondary | ICD-10-CM | POA: Diagnosis not present

## 2016-08-29 DIAGNOSIS — E785 Hyperlipidemia, unspecified: Secondary | ICD-10-CM | POA: Diagnosis not present

## 2016-08-29 DIAGNOSIS — I10 Essential (primary) hypertension: Secondary | ICD-10-CM | POA: Diagnosis not present

## 2016-09-05 DIAGNOSIS — J449 Chronic obstructive pulmonary disease, unspecified: Secondary | ICD-10-CM | POA: Diagnosis not present

## 2016-09-06 DIAGNOSIS — I252 Old myocardial infarction: Secondary | ICD-10-CM | POA: Diagnosis not present

## 2016-09-06 DIAGNOSIS — J449 Chronic obstructive pulmonary disease, unspecified: Secondary | ICD-10-CM | POA: Diagnosis not present

## 2016-09-06 DIAGNOSIS — I2510 Atherosclerotic heart disease of native coronary artery without angina pectoris: Secondary | ICD-10-CM | POA: Diagnosis not present

## 2016-09-06 DIAGNOSIS — E785 Hyperlipidemia, unspecified: Secondary | ICD-10-CM | POA: Diagnosis not present

## 2016-10-06 DIAGNOSIS — J449 Chronic obstructive pulmonary disease, unspecified: Secondary | ICD-10-CM | POA: Diagnosis not present

## 2016-11-06 DIAGNOSIS — J449 Chronic obstructive pulmonary disease, unspecified: Secondary | ICD-10-CM | POA: Diagnosis not present

## 2016-12-04 DIAGNOSIS — J449 Chronic obstructive pulmonary disease, unspecified: Secondary | ICD-10-CM | POA: Diagnosis not present

## 2016-12-06 DIAGNOSIS — E785 Hyperlipidemia, unspecified: Secondary | ICD-10-CM | POA: Diagnosis not present

## 2016-12-06 DIAGNOSIS — R0789 Other chest pain: Secondary | ICD-10-CM | POA: Diagnosis not present

## 2016-12-06 DIAGNOSIS — J449 Chronic obstructive pulmonary disease, unspecified: Secondary | ICD-10-CM | POA: Diagnosis not present

## 2016-12-06 DIAGNOSIS — I252 Old myocardial infarction: Secondary | ICD-10-CM | POA: Diagnosis not present

## 2016-12-06 DIAGNOSIS — I2510 Atherosclerotic heart disease of native coronary artery without angina pectoris: Secondary | ICD-10-CM | POA: Diagnosis not present

## 2017-01-04 DIAGNOSIS — J449 Chronic obstructive pulmonary disease, unspecified: Secondary | ICD-10-CM | POA: Diagnosis not present

## 2017-02-03 DIAGNOSIS — J449 Chronic obstructive pulmonary disease, unspecified: Secondary | ICD-10-CM | POA: Diagnosis not present

## 2017-02-20 DIAGNOSIS — J449 Chronic obstructive pulmonary disease, unspecified: Secondary | ICD-10-CM | POA: Diagnosis not present

## 2017-03-04 DIAGNOSIS — H43813 Vitreous degeneration, bilateral: Secondary | ICD-10-CM | POA: Diagnosis not present

## 2017-03-04 DIAGNOSIS — H2513 Age-related nuclear cataract, bilateral: Secondary | ICD-10-CM | POA: Diagnosis not present

## 2017-03-04 DIAGNOSIS — H43811 Vitreous degeneration, right eye: Secondary | ICD-10-CM | POA: Diagnosis not present

## 2017-03-06 DIAGNOSIS — J449 Chronic obstructive pulmonary disease, unspecified: Secondary | ICD-10-CM | POA: Diagnosis not present

## 2017-03-07 DIAGNOSIS — I252 Old myocardial infarction: Secondary | ICD-10-CM | POA: Diagnosis not present

## 2017-03-07 DIAGNOSIS — E785 Hyperlipidemia, unspecified: Secondary | ICD-10-CM | POA: Diagnosis not present

## 2017-03-07 DIAGNOSIS — J449 Chronic obstructive pulmonary disease, unspecified: Secondary | ICD-10-CM | POA: Diagnosis not present

## 2017-03-07 DIAGNOSIS — I2510 Atherosclerotic heart disease of native coronary artery without angina pectoris: Secondary | ICD-10-CM | POA: Diagnosis not present

## 2017-03-22 DIAGNOSIS — J449 Chronic obstructive pulmonary disease, unspecified: Secondary | ICD-10-CM | POA: Diagnosis not present

## 2017-04-05 DIAGNOSIS — J449 Chronic obstructive pulmonary disease, unspecified: Secondary | ICD-10-CM | POA: Diagnosis not present

## 2017-05-06 DIAGNOSIS — J449 Chronic obstructive pulmonary disease, unspecified: Secondary | ICD-10-CM | POA: Diagnosis not present

## 2017-06-06 DIAGNOSIS — J449 Chronic obstructive pulmonary disease, unspecified: Secondary | ICD-10-CM | POA: Diagnosis not present

## 2017-06-20 DIAGNOSIS — R0789 Other chest pain: Secondary | ICD-10-CM | POA: Diagnosis not present

## 2017-06-20 DIAGNOSIS — E785 Hyperlipidemia, unspecified: Secondary | ICD-10-CM | POA: Diagnosis not present

## 2017-06-20 DIAGNOSIS — I252 Old myocardial infarction: Secondary | ICD-10-CM | POA: Diagnosis not present

## 2017-06-20 DIAGNOSIS — I2510 Atherosclerotic heart disease of native coronary artery without angina pectoris: Secondary | ICD-10-CM | POA: Diagnosis not present

## 2017-06-20 DIAGNOSIS — J449 Chronic obstructive pulmonary disease, unspecified: Secondary | ICD-10-CM | POA: Diagnosis not present

## 2017-06-25 DIAGNOSIS — Z1231 Encounter for screening mammogram for malignant neoplasm of breast: Secondary | ICD-10-CM | POA: Diagnosis not present

## 2017-07-06 DIAGNOSIS — J449 Chronic obstructive pulmonary disease, unspecified: Secondary | ICD-10-CM | POA: Diagnosis not present

## 2017-07-24 DIAGNOSIS — H6063 Unspecified chronic otitis externa, bilateral: Secondary | ICD-10-CM | POA: Diagnosis not present

## 2017-08-04 DIAGNOSIS — H6983 Other specified disorders of Eustachian tube, bilateral: Secondary | ICD-10-CM | POA: Diagnosis not present

## 2017-08-04 DIAGNOSIS — H903 Sensorineural hearing loss, bilateral: Secondary | ICD-10-CM | POA: Diagnosis not present

## 2017-08-06 DIAGNOSIS — J449 Chronic obstructive pulmonary disease, unspecified: Secondary | ICD-10-CM | POA: Diagnosis not present

## 2017-08-21 DIAGNOSIS — H6981 Other specified disorders of Eustachian tube, right ear: Secondary | ICD-10-CM | POA: Diagnosis not present

## 2017-08-21 DIAGNOSIS — H90A31 Mixed conductive and sensorineural hearing loss, unilateral, right ear with restricted hearing on the contralateral side: Secondary | ICD-10-CM | POA: Diagnosis not present

## 2017-08-21 DIAGNOSIS — H90A22 Sensorineural hearing loss, unilateral, left ear, with restricted hearing on the contralateral side: Secondary | ICD-10-CM | POA: Diagnosis not present

## 2017-08-21 DIAGNOSIS — H6063 Unspecified chronic otitis externa, bilateral: Secondary | ICD-10-CM | POA: Diagnosis not present

## 2017-09-05 DIAGNOSIS — J449 Chronic obstructive pulmonary disease, unspecified: Secondary | ICD-10-CM | POA: Diagnosis not present

## 2017-09-11 DIAGNOSIS — I10 Essential (primary) hypertension: Secondary | ICD-10-CM | POA: Diagnosis not present

## 2017-09-11 DIAGNOSIS — E785 Hyperlipidemia, unspecified: Secondary | ICD-10-CM | POA: Diagnosis not present

## 2017-09-12 DIAGNOSIS — I2510 Atherosclerotic heart disease of native coronary artery without angina pectoris: Secondary | ICD-10-CM | POA: Diagnosis not present

## 2017-09-12 DIAGNOSIS — J449 Chronic obstructive pulmonary disease, unspecified: Secondary | ICD-10-CM | POA: Diagnosis not present

## 2017-09-12 DIAGNOSIS — I252 Old myocardial infarction: Secondary | ICD-10-CM | POA: Diagnosis not present

## 2017-09-12 DIAGNOSIS — E785 Hyperlipidemia, unspecified: Secondary | ICD-10-CM | POA: Diagnosis not present

## 2017-09-12 DIAGNOSIS — R0789 Other chest pain: Secondary | ICD-10-CM | POA: Diagnosis not present

## 2017-10-06 DIAGNOSIS — J449 Chronic obstructive pulmonary disease, unspecified: Secondary | ICD-10-CM | POA: Diagnosis not present

## 2017-10-28 ENCOUNTER — Ambulatory Visit (INDEPENDENT_AMBULATORY_CARE_PROVIDER_SITE_OTHER): Payer: Medicare Other

## 2017-10-28 ENCOUNTER — Other Ambulatory Visit: Payer: Self-pay

## 2017-10-28 ENCOUNTER — Ambulatory Visit (HOSPITAL_COMMUNITY)
Admission: EM | Admit: 2017-10-28 | Discharge: 2017-10-28 | Disposition: A | Discharge status: Home / Self Care | Payer: Medicare Other | Attending: Emergency Medicine | Admitting: Emergency Medicine

## 2017-10-28 ENCOUNTER — Encounter (HOSPITAL_COMMUNITY): Payer: Self-pay | Admitting: Emergency Medicine

## 2017-10-28 DIAGNOSIS — R079 Chest pain, unspecified: Secondary | ICD-10-CM | POA: Diagnosis not present

## 2017-10-28 DIAGNOSIS — J22 Unspecified acute lower respiratory infection: Secondary | ICD-10-CM

## 2017-10-28 DIAGNOSIS — R05 Cough: Secondary | ICD-10-CM | POA: Diagnosis not present

## 2017-10-28 MED ORDER — AZITHROMYCIN 250 MG PO TABS: ORAL_TABLET | ORAL | 0 refills | Status: DC

## 2017-10-28 MED ORDER — PREDNISONE 20 MG PO TABS: 40.0000 mg | ORAL_TABLET | Freq: Every day | ORAL | 0 refills | Status: AC

## 2017-10-28 NOTE — Discharge Instructions (Signed)
Push fluids to ensure adequate hydration and keep secretions thin.  Tylenol and/or ibuprofen as needed for pain or fevers.  Complete course of antibiotics. 5 days of prednisone to help with cough and pain. If you develop worsening pain, shortness of breath, dizziness, chest pain, difficulty breathing, nausea, pain to arm or jaw, fevers, lethargy please go to Er.

## 2017-10-28 NOTE — ED Triage Notes (Signed)
Patient has a cough, reports when she has to cough, she has pain in chest, burning pain in chest.  Onset yesterday of burning in chest with cough.  Today it is worse.  Today she is also dizzy. No known fever

## 2017-10-28 NOTE — ED Provider Notes (Signed)
[place]    CSN: [id number] Arrival date & time: 10/28/17  1254     History   Chief Complaint Chief Complaint  Patient presents with  . Cough    HPI Victoria Kennedy is a 72 y.o. female.   Young presents with complaints of cough which causes shortness of breath, fevers, which started yesterday. Cough is non productive. Without congestion. She feels dizzy. Fatigue started two days ago. Took tylenol yesterday which did seem to help, has not taken today. denies gi/gu complaints. History of COPD and respiratory failure, intermittent O2 by nasal cannula use. States she used yesterday. Uses daily symbicort and proair as needed. Has not been using proair. Has not seen her pulmonologist in a few years. Follows with cardiology for MI history, last seen in December. Takes baby asiprin daily. Quit smoking in 2014.     ROS per HPI.       Past Medical History:  Diagnosis Date  . Cancer ([place])   . COPD (chronic obstructive pulmonary disease) [facility])     Patient Active Problem List   Diagnosis Date Noted  . Rash and nonspecific skin eruption 03/17/2015  . Health care maintenance 01/23/2015  . Chronic respiratory failure ([place]) 04/21/2013  . COPD GOLD II with large AB component 12/05/2012    Past Surgical History:  Procedure Laterality Date  . LEFT HEART CATHETERIZATION WITH CORONARY ANGIOGRAM N/A 10/30/2012   Procedure: LEFT HEART CATHETERIZATION WITH CORONARY ANGIOGRAM;  Surgeon: Clent Demark, MD;  Location[facility] C[facility] LAB;  Service: Cardiovascular;  Laterality: N/A;    OB History    No data available       Home Medications    Prior to Admission medications   Medication Sig Start Date End Date Taking? Authorizing Provider  albuterol (PROAIR HFA) 108 (90 BASE) MCG/ACT inhaler Inhale 2 puffs into the lungs every 4 (four) hours as needed for wheezing or shortness of breath.   Yes [provider]  aspirin EC 81 MG tablet Take 81 mg by mouth daily.   Yes  [provider]  atorvastatin (LIPITOR) 20 MG tablet Take 20 mg by mouth daily at 6 PM. 11/06/12  Yes Charolette Forward, MD  budesonide-formoterol (SYMBICORT) 160-4.5 MCG/ACT inhaler Take 2 puffs first thing in am and then another 2 puffs about 12 hours later. 03/02/14  Yes Tanda Rockers, MD  Cholecalciferol (VITAMIN D) 2000 UNITS tablet Take 2,000 Units by mouth daily.   Yes [provider]  docusate sodium (COLACE) 100 MG capsule Take 100 mg by mouth 2 (two) times daily.    Yes [provider]  azithromycin (ZITHROMAX) 250 MG tablet Take first 2 tablets together, then 1 every day until finished. 10/28/17   Zigmund Gottron, NP  nitroGLYCERIN (NITROSTAT) 0.4 MG SL tablet Place 0.4 mg under the tongue every 5 (five) minutes x 3 doses as needed for chest pain. 11/06/12   Charolette Forward, MD  polyethylene glycol (MIRALAX / GLYCOLAX) packet Take 17 g by mouth daily as needed.     [provider]  predniSONE (DELTASONE) 20 MG tablet Take 2 tablets (40 mg total) by mouth daily with breakfast for 5 days. 10/28/17 11/02/17  Zigmund Gottron, NP  valACYclovir (VALTREX) 500 MG tablet Take 1 tablet (500 mg total) by mouth 2 (two) times daily. 03/17/15   Hoyt Koch, MD    Family History Family History  Problem Relation Age of Onset  . Emphysema Mother  was a smoker  . Rectal cancer Maternal Grandmother     Social History Social History   Tobacco Use  . Smoking status: Former Smoker    Packs/day: 1.00    Years: 35.00    Pack years: 35.00    Types: Cigarettes    Last attempt to quit: 11/06/2012    Years since quitting: 4.9  . Smokeless tobacco: Never Used  Substance Use Topics  . Alcohol use: No  . Drug use: No     Allergies   Benadryl [diphenhydramine hcl] and Tylenol [acetaminophen]   Review of Systems Review of Systems   Physical Exam Triage Vital Signs ED Triage Vitals  Enc Vitals Group     BP 10/28/17 1433 106/66     Pulse Rate  10/28/17 1433 87     Resp 10/28/17 1433 (!) 28     Temp 10/28/17 1433 100 F (37.8 C)     Temp Source 10/28/17 1433 Oral     SpO2 10/28/17 1433 96 %     Weight --      Height --      Head Circumference --      Peak Flow --      Pain Score 10/28/17 1430 8     Pain Loc --      Pain Edu? --      Excl. in [place]? --    No data found.  Updated Vital Signs BP 106/66 (BP Location: Left Arm)   Pulse 87   Temp 100 F (37.8 C) (Oral)   Resp (!) 28   SpO2 96%   Visual Acuity Right Eye Distance:   Left Eye Distance:   Bilateral Distance:    Right Eye Near:   Left Eye Near:    Bilateral Near:     Physical Exam  Constitutional: She is oriented to person, place, and time. She appears well-developed and well-nourished. No distress.  HENT:  Head: Normocephalic and atraumatic.  Right Ear: Tympanic membrane, external ear and ear canal normal.  Left Ear: Tympanic membrane, external ear and ear canal normal.  Nose: Nose normal.  Mouth/Throat: Uvula is midline, oropharynx is clear and moist and mucous membranes are normal. No tonsillar exudate.  Eyes: Conjunctivae and EOM are normal. Pupils are equal, round, and reactive to light.  Cardiovascular: Normal rate, regular rhythm and normal heart sounds.  Pulmonary/Chest: Effort normal. She has decreased breath sounds.  Strong congested cough noted   Musculoskeletal: Normal range of motion.  Neurological: She is alert and oriented to person, place, and time. No cranial nerve deficit or sensory deficit.  Skin: Skin is warm and dry.   ekg without signs of acute changes, reviewed with supervising physician dr. Alphonzo Cruise.   UC Treatments / Results  Labs (all labs ordered are listed, but only abnormal results are displayed) Labs Reviewed - No data to display  EKG  EKG Interpretation None       Radiology Dg Chest 2 View  Result Date: 10/28/2017 CLINICAL DATA:  Cough, fever chest pain for 1 day. EXAM: CHEST  2 VIEW COMPARISON:  03/02/2014  and prior radiograph FINDINGS: The cardiomediastinal silhouette is unremarkable. COPD/emphysema changes again noted. There is no evidence of focal airspace disease, pulmonary edema, suspicious pulmonary nodule/mass, pleural effusion, or pneumothorax. No acute bony abnormalities are identified. IMPRESSION: COPD/emphysema without evidence of acute cardiopulmonary disease. Electronically Signed   By: Margarette Canada M.D.   On: 10/28/2017 16:09    Procedures Procedures (including critical care time)  Medications Ordered in UC Medications - No data to display   Initial Impression / Assessment and Plan / UC Course  I have reviewed the triage vital signs and the nursing notes.  Pertinent labs & imaging results that were available during my care of the patient were reviewed by me and considered in my medical decision making (see chart for details).     Non toxic in appearance. Without hypoxia. Fatigue, dizzy and elevated temperatures noted. Copd history. Chest xray without acute findings at this time. Will treat empirically with azithromycin. Prednisone x5 days. ekg without acute changes. Return precautions provided. If symptoms worsen or do not improve in the next week to return to be seen or to follow up with PCP.  Patient and sister verbalized understanding and agreeable to plan.    Final Clinical Impressions(s) / UC Diagnoses   Final diagnoses:  Lower respiratory tract infection    ED Discharge Orders        Ordered    azithromycin (ZITHROMAX) 250 MG tablet     10/28/17 1613    predniSONE (DELTASONE) 20 MG tablet  Daily with breakfast     10/28/17 1613       Controlled Substance Prescriptions [place] Controlled Substance Registry consulted? Not Applicable   Zigmund Gottron, NP 10/28/17 1615

## 2017-11-06 DIAGNOSIS — J449 Chronic obstructive pulmonary disease, unspecified: Secondary | ICD-10-CM | POA: Diagnosis not present

## 2017-12-04 DIAGNOSIS — J449 Chronic obstructive pulmonary disease, unspecified: Secondary | ICD-10-CM | POA: Diagnosis not present

## 2017-12-12 DIAGNOSIS — I2510 Atherosclerotic heart disease of native coronary artery without angina pectoris: Secondary | ICD-10-CM | POA: Diagnosis not present

## 2017-12-12 DIAGNOSIS — J449 Chronic obstructive pulmonary disease, unspecified: Secondary | ICD-10-CM | POA: Diagnosis not present

## 2017-12-12 DIAGNOSIS — I252 Old myocardial infarction: Secondary | ICD-10-CM | POA: Diagnosis not present

## 2017-12-12 DIAGNOSIS — E785 Hyperlipidemia, unspecified: Secondary | ICD-10-CM | POA: Diagnosis not present

## 2018-01-04 DIAGNOSIS — J449 Chronic obstructive pulmonary disease, unspecified: Secondary | ICD-10-CM | POA: Diagnosis not present

## 2018-01-06 ENCOUNTER — Telehealth: Payer: Self-pay | Admitting: Internal Medicine

## 2018-01-06 NOTE — Telephone Encounter (Signed)
Spoke with Victoria Kennedy today. Patient would like to switch from Dr. Sharlet Salina to Dr. Jenny Reichmann. Please advise. SF

## 2018-01-06 NOTE — Telephone Encounter (Signed)
Fine

## 2018-01-06 NOTE — Telephone Encounter (Signed)
Very sorry, I am not able to accept at this time

## 2018-01-08 NOTE — Telephone Encounter (Signed)
Spoke with Ms. Vanhandel today and informed her that Dr. Jenny Reichmann is not accepting patients at this time. Asked patient if she would like to transfer to another provider at a different [place] location. Patient stated that she was not interested in going to a different location and that she will continue to see her cardiologist when she needs anything. She also stated that she does not want Dr. Sharlet Salina as her PCP. Informed Ms. Rios that Dr. Sharlet Salina will be removed as her PCP and if she wants to return to [place] to give the office a call. Patient stated understanding. SF

## 2018-02-03 DIAGNOSIS — J449 Chronic obstructive pulmonary disease, unspecified: Secondary | ICD-10-CM | POA: Diagnosis not present

## 2018-03-06 DIAGNOSIS — J449 Chronic obstructive pulmonary disease, unspecified: Secondary | ICD-10-CM | POA: Diagnosis not present

## 2018-03-10 DIAGNOSIS — J449 Chronic obstructive pulmonary disease, unspecified: Secondary | ICD-10-CM | POA: Diagnosis not present

## 2018-03-10 DIAGNOSIS — I2510 Atherosclerotic heart disease of native coronary artery without angina pectoris: Secondary | ICD-10-CM | POA: Diagnosis not present

## 2018-03-10 DIAGNOSIS — I252 Old myocardial infarction: Secondary | ICD-10-CM | POA: Diagnosis not present

## 2018-03-10 DIAGNOSIS — I10 Essential (primary) hypertension: Secondary | ICD-10-CM | POA: Diagnosis not present

## 2018-04-05 DIAGNOSIS — J449 Chronic obstructive pulmonary disease, unspecified: Secondary | ICD-10-CM | POA: Diagnosis not present

## 2018-05-06 DIAGNOSIS — J449 Chronic obstructive pulmonary disease, unspecified: Secondary | ICD-10-CM | POA: Diagnosis not present

## 2018-05-21 DIAGNOSIS — H906 Mixed conductive and sensorineural hearing loss, bilateral: Secondary | ICD-10-CM | POA: Diagnosis not present

## 2018-05-21 DIAGNOSIS — H6063 Unspecified chronic otitis externa, bilateral: Secondary | ICD-10-CM | POA: Diagnosis not present

## 2018-06-06 DIAGNOSIS — J449 Chronic obstructive pulmonary disease, unspecified: Secondary | ICD-10-CM | POA: Diagnosis not present

## 2018-06-12 DIAGNOSIS — E785 Hyperlipidemia, unspecified: Secondary | ICD-10-CM | POA: Diagnosis not present

## 2018-06-12 DIAGNOSIS — J449 Chronic obstructive pulmonary disease, unspecified: Secondary | ICD-10-CM | POA: Diagnosis not present

## 2018-06-12 DIAGNOSIS — R0789 Other chest pain: Secondary | ICD-10-CM | POA: Diagnosis not present

## 2018-06-12 DIAGNOSIS — I2510 Atherosclerotic heart disease of native coronary artery without angina pectoris: Secondary | ICD-10-CM | POA: Diagnosis not present

## 2018-06-12 DIAGNOSIS — I252 Old myocardial infarction: Secondary | ICD-10-CM | POA: Diagnosis not present

## 2018-06-15 DIAGNOSIS — H6063 Unspecified chronic otitis externa, bilateral: Secondary | ICD-10-CM | POA: Diagnosis not present

## 2018-06-26 DIAGNOSIS — Z1231 Encounter for screening mammogram for malignant neoplasm of breast: Secondary | ICD-10-CM | POA: Diagnosis not present

## 2018-07-06 DIAGNOSIS — J449 Chronic obstructive pulmonary disease, unspecified: Secondary | ICD-10-CM | POA: Diagnosis not present

## 2018-08-06 DIAGNOSIS — J449 Chronic obstructive pulmonary disease, unspecified: Secondary | ICD-10-CM | POA: Diagnosis not present

## 2018-09-05 DIAGNOSIS — J449 Chronic obstructive pulmonary disease, unspecified: Secondary | ICD-10-CM | POA: Diagnosis not present

## 2018-09-09 DIAGNOSIS — E785 Hyperlipidemia, unspecified: Secondary | ICD-10-CM | POA: Diagnosis not present

## 2018-09-09 DIAGNOSIS — I10 Essential (primary) hypertension: Secondary | ICD-10-CM | POA: Diagnosis not present

## 2018-09-09 DIAGNOSIS — J449 Chronic obstructive pulmonary disease, unspecified: Secondary | ICD-10-CM | POA: Diagnosis not present

## 2018-09-11 DIAGNOSIS — I2510 Atherosclerotic heart disease of native coronary artery without angina pectoris: Secondary | ICD-10-CM | POA: Diagnosis not present

## 2018-09-11 DIAGNOSIS — J449 Chronic obstructive pulmonary disease, unspecified: Secondary | ICD-10-CM | POA: Diagnosis not present

## 2018-09-11 DIAGNOSIS — E785 Hyperlipidemia, unspecified: Secondary | ICD-10-CM | POA: Diagnosis not present

## 2018-09-11 DIAGNOSIS — I252 Old myocardial infarction: Secondary | ICD-10-CM | POA: Diagnosis not present

## 2018-09-29 DIAGNOSIS — H6063 Unspecified chronic otitis externa, bilateral: Secondary | ICD-10-CM | POA: Diagnosis not present

## 2018-10-06 DIAGNOSIS — J449 Chronic obstructive pulmonary disease, unspecified: Secondary | ICD-10-CM | POA: Diagnosis not present

## 2018-11-06 DIAGNOSIS — J449 Chronic obstructive pulmonary disease, unspecified: Secondary | ICD-10-CM | POA: Diagnosis not present

## 2018-12-04 DIAGNOSIS — J449 Chronic obstructive pulmonary disease, unspecified: Secondary | ICD-10-CM | POA: Diagnosis not present

## 2018-12-04 DIAGNOSIS — I2510 Atherosclerotic heart disease of native coronary artery without angina pectoris: Secondary | ICD-10-CM | POA: Diagnosis not present

## 2018-12-04 DIAGNOSIS — I252 Old myocardial infarction: Secondary | ICD-10-CM | POA: Diagnosis not present

## 2018-12-04 DIAGNOSIS — E785 Hyperlipidemia, unspecified: Secondary | ICD-10-CM | POA: Diagnosis not present

## 2018-12-05 DIAGNOSIS — J449 Chronic obstructive pulmonary disease, unspecified: Secondary | ICD-10-CM | POA: Diagnosis not present

## 2019-01-05 DIAGNOSIS — J449 Chronic obstructive pulmonary disease, unspecified: Secondary | ICD-10-CM | POA: Diagnosis not present

## 2019-02-04 DIAGNOSIS — J449 Chronic obstructive pulmonary disease, unspecified: Secondary | ICD-10-CM | POA: Diagnosis not present

## 2019-02-26 DIAGNOSIS — I2510 Atherosclerotic heart disease of native coronary artery without angina pectoris: Secondary | ICD-10-CM | POA: Diagnosis not present

## 2019-02-26 DIAGNOSIS — J449 Chronic obstructive pulmonary disease, unspecified: Secondary | ICD-10-CM | POA: Diagnosis not present

## 2019-02-26 DIAGNOSIS — R0789 Other chest pain: Secondary | ICD-10-CM | POA: Diagnosis not present

## 2019-02-26 DIAGNOSIS — E785 Hyperlipidemia, unspecified: Secondary | ICD-10-CM | POA: Diagnosis not present

## 2019-02-26 DIAGNOSIS — I252 Old myocardial infarction: Secondary | ICD-10-CM | POA: Diagnosis not present

## 2019-03-07 DIAGNOSIS — J449 Chronic obstructive pulmonary disease, unspecified: Secondary | ICD-10-CM | POA: Diagnosis not present

## 2019-04-06 DIAGNOSIS — J449 Chronic obstructive pulmonary disease, unspecified: Secondary | ICD-10-CM | POA: Diagnosis not present

## 2019-05-07 DIAGNOSIS — J449 Chronic obstructive pulmonary disease, unspecified: Secondary | ICD-10-CM | POA: Diagnosis not present

## 2019-06-07 DIAGNOSIS — J449 Chronic obstructive pulmonary disease, unspecified: Secondary | ICD-10-CM | POA: Diagnosis not present

## 2019-06-18 DIAGNOSIS — J449 Chronic obstructive pulmonary disease, unspecified: Secondary | ICD-10-CM | POA: Diagnosis not present

## 2019-06-18 DIAGNOSIS — I2510 Atherosclerotic heart disease of native coronary artery without angina pectoris: Secondary | ICD-10-CM | POA: Diagnosis not present

## 2019-06-18 DIAGNOSIS — E785 Hyperlipidemia, unspecified: Secondary | ICD-10-CM | POA: Diagnosis not present

## 2019-07-07 DIAGNOSIS — J449 Chronic obstructive pulmonary disease, unspecified: Secondary | ICD-10-CM | POA: Diagnosis not present

## 2019-08-07 DIAGNOSIS — J449 Chronic obstructive pulmonary disease, unspecified: Secondary | ICD-10-CM | POA: Diagnosis not present

## 2019-09-01 DIAGNOSIS — I10 Essential (primary) hypertension: Secondary | ICD-10-CM | POA: Diagnosis not present

## 2019-09-01 DIAGNOSIS — E785 Hyperlipidemia, unspecified: Secondary | ICD-10-CM | POA: Diagnosis not present

## 2019-09-03 DIAGNOSIS — I2510 Atherosclerotic heart disease of native coronary artery without angina pectoris: Secondary | ICD-10-CM | POA: Diagnosis not present

## 2019-09-03 DIAGNOSIS — J449 Chronic obstructive pulmonary disease, unspecified: Secondary | ICD-10-CM | POA: Diagnosis not present

## 2019-09-03 DIAGNOSIS — E785 Hyperlipidemia, unspecified: Secondary | ICD-10-CM | POA: Diagnosis not present

## 2019-09-03 DIAGNOSIS — R0789 Other chest pain: Secondary | ICD-10-CM | POA: Diagnosis not present

## 2019-09-06 DIAGNOSIS — J449 Chronic obstructive pulmonary disease, unspecified: Secondary | ICD-10-CM | POA: Diagnosis not present

## 2019-10-07 DIAGNOSIS — J449 Chronic obstructive pulmonary disease, unspecified: Secondary | ICD-10-CM | POA: Diagnosis not present

## 2019-11-07 DIAGNOSIS — J449 Chronic obstructive pulmonary disease, unspecified: Secondary | ICD-10-CM | POA: Diagnosis not present

## 2019-12-03 DIAGNOSIS — E785 Hyperlipidemia, unspecified: Secondary | ICD-10-CM | POA: Diagnosis not present

## 2019-12-03 DIAGNOSIS — I10 Essential (primary) hypertension: Secondary | ICD-10-CM | POA: Diagnosis not present

## 2019-12-03 DIAGNOSIS — I2510 Atherosclerotic heart disease of native coronary artery without angina pectoris: Secondary | ICD-10-CM | POA: Diagnosis not present

## 2019-12-03 DIAGNOSIS — J449 Chronic obstructive pulmonary disease, unspecified: Secondary | ICD-10-CM | POA: Diagnosis not present

## 2019-12-05 DIAGNOSIS — J449 Chronic obstructive pulmonary disease, unspecified: Secondary | ICD-10-CM | POA: Diagnosis not present

## 2020-01-05 DIAGNOSIS — J449 Chronic obstructive pulmonary disease, unspecified: Secondary | ICD-10-CM | POA: Diagnosis not present

## 2020-01-10 IMAGING — DX DG CHEST 2V
2 series · 2 of 2 positions shown · non-contrast
Comparison: 03/02/2014 and prior radiograph

CLINICAL DATA: Cough, fever chest pain for 1 day.

EXAM:
CHEST  2 VIEW

[chest pa]
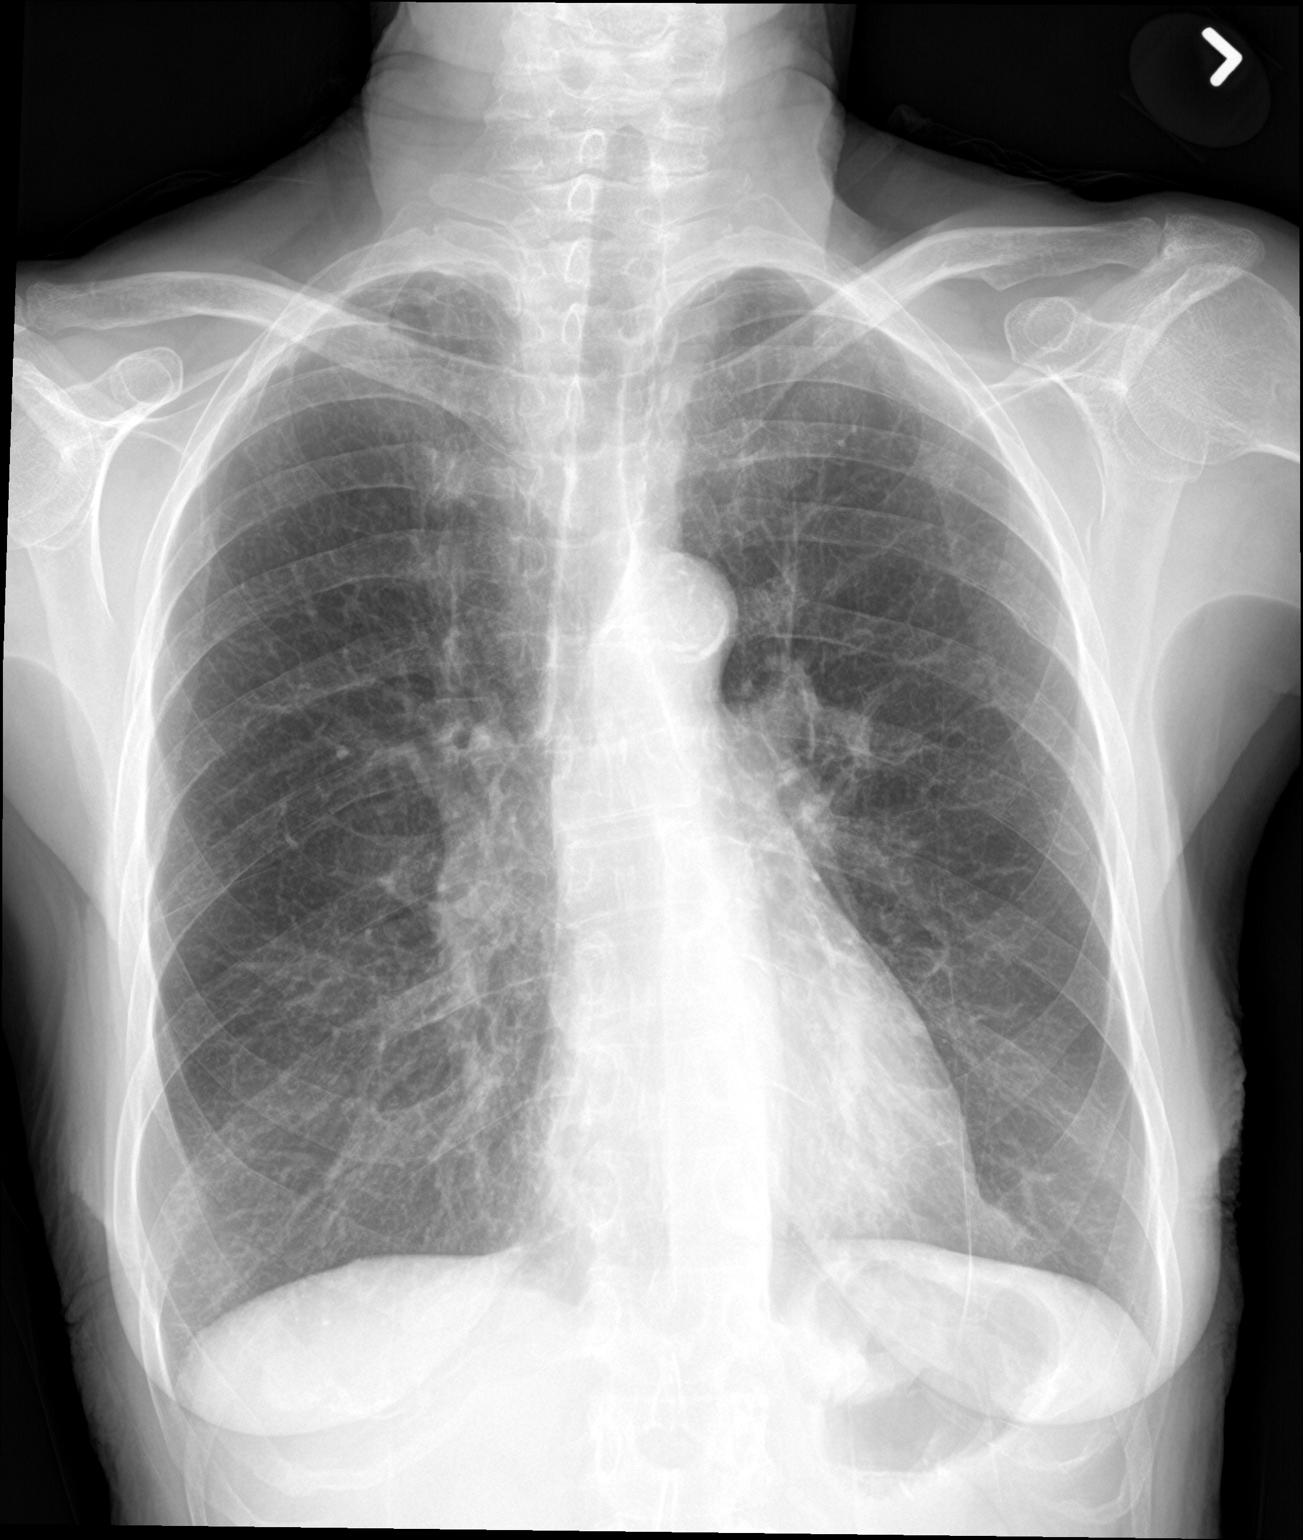

[chest lat]
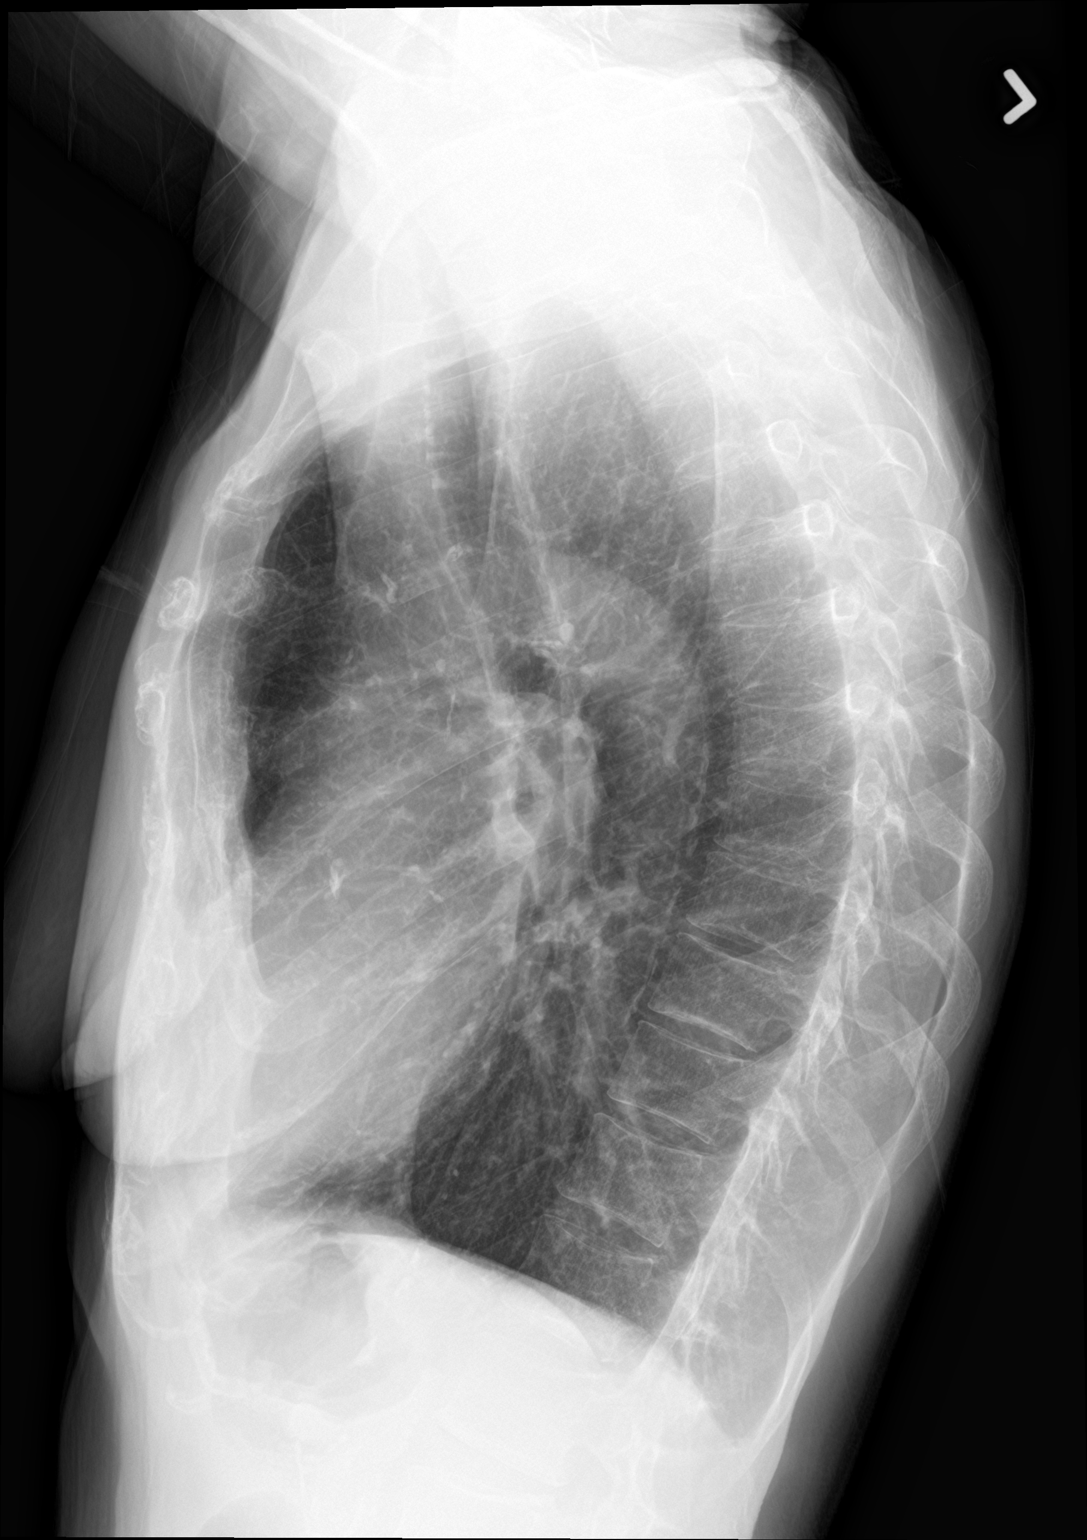

[2 of 2 positions shown; findings below may reference images not displayed]

FINDINGS: The cardiomediastinal silhouette is unremarkable.

COPD/emphysema changes again noted.

There is no evidence of focal airspace disease, pulmonary edema,
suspicious pulmonary nodule/mass, pleural effusion, or pneumothorax.

No acute bony abnormalities are identified.
IMPRESSION: COPD/emphysema without evidence of acute cardiopulmonary disease.

## 2020-02-04 DIAGNOSIS — J449 Chronic obstructive pulmonary disease, unspecified: Secondary | ICD-10-CM | POA: Diagnosis not present

## 2020-03-03 DIAGNOSIS — J449 Chronic obstructive pulmonary disease, unspecified: Secondary | ICD-10-CM | POA: Diagnosis not present

## 2020-03-03 DIAGNOSIS — I2510 Atherosclerotic heart disease of native coronary artery without angina pectoris: Secondary | ICD-10-CM | POA: Diagnosis not present

## 2020-03-03 DIAGNOSIS — E785 Hyperlipidemia, unspecified: Secondary | ICD-10-CM | POA: Diagnosis not present

## 2020-03-03 DIAGNOSIS — I10 Essential (primary) hypertension: Secondary | ICD-10-CM | POA: Diagnosis not present

## 2020-03-06 DIAGNOSIS — J449 Chronic obstructive pulmonary disease, unspecified: Secondary | ICD-10-CM | POA: Diagnosis not present

## 2020-04-05 DIAGNOSIS — J449 Chronic obstructive pulmonary disease, unspecified: Secondary | ICD-10-CM | POA: Diagnosis not present

## 2020-05-06 DIAGNOSIS — J449 Chronic obstructive pulmonary disease, unspecified: Secondary | ICD-10-CM | POA: Diagnosis not present

## 2020-06-02 DIAGNOSIS — F17290 Nicotine dependence, other tobacco product, uncomplicated: Secondary | ICD-10-CM | POA: Diagnosis not present

## 2020-06-02 DIAGNOSIS — I10 Essential (primary) hypertension: Secondary | ICD-10-CM | POA: Diagnosis not present

## 2020-06-02 DIAGNOSIS — I2510 Atherosclerotic heart disease of native coronary artery without angina pectoris: Secondary | ICD-10-CM | POA: Diagnosis not present

## 2020-06-02 DIAGNOSIS — E785 Hyperlipidemia, unspecified: Secondary | ICD-10-CM | POA: Diagnosis not present

## 2020-06-06 DIAGNOSIS — J449 Chronic obstructive pulmonary disease, unspecified: Secondary | ICD-10-CM | POA: Diagnosis not present

## 2020-07-06 DIAGNOSIS — J449 Chronic obstructive pulmonary disease, unspecified: Secondary | ICD-10-CM | POA: Diagnosis not present

## 2020-08-06 DIAGNOSIS — J449 Chronic obstructive pulmonary disease, unspecified: Secondary | ICD-10-CM | POA: Diagnosis not present

## 2020-09-01 DIAGNOSIS — J449 Chronic obstructive pulmonary disease, unspecified: Secondary | ICD-10-CM | POA: Diagnosis not present

## 2020-09-01 DIAGNOSIS — I10 Essential (primary) hypertension: Secondary | ICD-10-CM | POA: Diagnosis not present

## 2020-09-01 DIAGNOSIS — E785 Hyperlipidemia, unspecified: Secondary | ICD-10-CM | POA: Diagnosis not present

## 2020-09-01 DIAGNOSIS — I2510 Atherosclerotic heart disease of native coronary artery without angina pectoris: Secondary | ICD-10-CM | POA: Diagnosis not present

## 2020-09-05 DIAGNOSIS — J449 Chronic obstructive pulmonary disease, unspecified: Secondary | ICD-10-CM | POA: Diagnosis not present

## 2020-10-06 DIAGNOSIS — J449 Chronic obstructive pulmonary disease, unspecified: Secondary | ICD-10-CM | POA: Diagnosis not present

## 2020-11-06 DIAGNOSIS — J449 Chronic obstructive pulmonary disease, unspecified: Secondary | ICD-10-CM | POA: Diagnosis not present

## 2020-12-04 DIAGNOSIS — J449 Chronic obstructive pulmonary disease, unspecified: Secondary | ICD-10-CM | POA: Diagnosis not present

## 2021-01-04 DIAGNOSIS — J449 Chronic obstructive pulmonary disease, unspecified: Secondary | ICD-10-CM | POA: Diagnosis not present

## 2021-01-12 DIAGNOSIS — E785 Hyperlipidemia, unspecified: Secondary | ICD-10-CM | POA: Diagnosis not present

## 2021-01-12 DIAGNOSIS — I2510 Atherosclerotic heart disease of native coronary artery without angina pectoris: Secondary | ICD-10-CM | POA: Diagnosis not present

## 2021-01-12 DIAGNOSIS — I10 Essential (primary) hypertension: Secondary | ICD-10-CM | POA: Diagnosis not present

## 2021-01-12 DIAGNOSIS — J449 Chronic obstructive pulmonary disease, unspecified: Secondary | ICD-10-CM | POA: Diagnosis not present

## 2021-02-03 DIAGNOSIS — J449 Chronic obstructive pulmonary disease, unspecified: Secondary | ICD-10-CM | POA: Diagnosis not present

## 2021-03-06 DIAGNOSIS — J449 Chronic obstructive pulmonary disease, unspecified: Secondary | ICD-10-CM | POA: Diagnosis not present

## 2021-04-04 ENCOUNTER — Ambulatory Visit (HOSPITAL_COMMUNITY)
Admission: EM | Admit: 2021-04-04 | Discharge: 2021-04-04 | Disposition: A | Discharge status: Home / Self Care | Payer: Medicare Other | Attending: Family Medicine | Admitting: Family Medicine

## 2021-04-04 ENCOUNTER — Other Ambulatory Visit: Payer: Self-pay

## 2021-04-04 ENCOUNTER — Encounter (HOSPITAL_COMMUNITY): Payer: Self-pay

## 2021-04-04 DIAGNOSIS — J441 Chronic obstructive pulmonary disease with (acute) exacerbation: Secondary | ICD-10-CM

## 2021-04-04 DIAGNOSIS — J9611 Chronic respiratory failure with hypoxia: Secondary | ICD-10-CM

## 2021-04-04 DIAGNOSIS — Z9981 Dependence on supplemental oxygen: Secondary | ICD-10-CM | POA: Diagnosis not present

## 2021-04-04 DIAGNOSIS — R531 Weakness: Secondary | ICD-10-CM

## 2021-04-04 MED ORDER — PREDNISONE 20 MG PO TABS: 40.0000 mg | ORAL_TABLET | Freq: Every day | ORAL | 0 refills | Status: AC

## 2021-04-04 MED ORDER — GUAIFENESIN ER 600 MG PO TB12: 600.0000 mg | ORAL_TABLET | Freq: Two times a day (BID) | ORAL | 0 refills | Status: AC

## 2021-04-04 MED ORDER — AZITHROMYCIN 250 MG PO TABS: ORAL_TABLET | ORAL | 0 refills | Status: AC

## 2021-04-04 NOTE — ED Triage Notes (Signed)
Pt presents with productive cough with green mucus and congestion for over a week.

## 2021-04-04 NOTE — ED Provider Notes (Signed)
[place]    CSN: [id number] Arrival date & time: 04/04/21  1133      History   Chief Complaint Chief Complaint  Patient presents with   URI    HPI AFTAN VINT is a 75 y.o. female.   Patient presenting today with over a week of productive cough of green mucus, wheezing, chest tightness, shortness of breath.  Has a history of severe COPD on intermittent O2 at home via nasal cannula, chronic respiratory failure.  She is currently on Symbicort and albuterol inhaler as needed, otherwise not trying anything over-the-counter for symptoms.  Did not get any COVID testing at onset of her symptoms.  No recent sick contacts.  She denies dizziness, weakness, syncope, decreased p.o. intake, chest pain, sore throat, headaches.   Past Medical History:  Diagnosis Date   Cancer [facility])    COPD (chronic obstructive pulmonary disease) ([place])     Patient Active Problem List   Diagnosis Date Noted   Rash and nonspecific skin eruption 03/17/2015   Health care maintenance 01/23/2015   Chronic respiratory failure ([place]) 04/21/2013   COPD GOLD II with large AB component 12/05/2012    Past Surgical History:  Procedure Laterality Date   LEFT HEART CATHETERIZATION WITH CORONARY ANGIOGRAM N/A 10/30/2012   Procedure: LEFT HEART CATHETERIZATION WITH CORONARY ANGIOGRAM;  Surgeon: Clent Demark, MD;  Location: [facility] CATH LAB;  Service: Cardiovascular;  Laterality: N/A;    OB History   No obstetric history on file.      Home Medications    Prior to Admission medications   Medication Sig Start Date End Date Taking? Authorizing Provider  guaiFENesin (MUCINEX) 600 MG 12 hr tablet Take 1 tablet (600 mg total) by mouth 2 (two) times daily. 04/04/21  Yes Volney American, PA-C  predniSONE (DELTASONE) 20 MG tablet Take 2 tablets (40 mg total) by mouth daily with breakfast. 04/04/21  Yes Volney American, PA-C  albuterol [facility] HFA) 108 (90 BASE) MCG/ACT inhaler Inhale 2 puffs into  the lungs every 4 (four) hours as needed for wheezing or shortness of breath.    [provider]  aspirin EC 81 MG tablet Take 81 mg by mouth daily.    [provider]  atorvastatin (LIPITOR) 20 MG tablet Take 20 mg by mouth daily at 6 PM. 11/06/12   Charolette Forward, MD  azithromycin (ZITHROMAX) 250 MG tablet Take first 2 tablets together, then 1 every day until finished. 04/04/21   Volney American, PA-C  budesonide-formoterol [facility]) 160-4.5 MCG/ACT inhaler Take 2 puffs first thing in am and then another 2 puffs about 12 hours later. 03/02/14   Tanda Rockers, MD  Cholecalciferol (VITAMIN D) 2000 UNITS tablet Take 2,000 Units by mouth daily.    [provider]  docusate sodium (COLACE) 100 MG capsule Take 100 mg by mouth 2 (two) times daily.     [provider]  nitroGLYCERIN (NITROSTAT) 0.4 MG SL tablet Place 0.4 mg under the tongue every 5 (five) minutes x 3 doses as needed for chest pain. 11/06/12   Charolette Forward, MD  polyethylene glycol (MIRALAX / GLYCOLAX) packet Take 17 g by mouth daily as needed.     [provider]  valACYclovir (VALTREX) 500 MG tablet Take 1 tablet (500 mg total) by mouth 2 (two) times daily. 03/17/15   Hoyt Koch, MD    Family History Family History  Problem Relation Age of Onset   Emphysema Mother  was a smoker   Rectal cancer Maternal Grandmother     Social History Social History   Tobacco Use   Smoking status: Former    Packs/day: 1.00    Years: 35.00    Pack years: 35.00    Types: Cigarettes    Quit date: 11/06/2012    Years since quitting: 8.4   Smokeless tobacco: Never  Substance Use Topics   Alcohol use: No   Drug use: No     Allergies   Benadryl [diphenhydramine hcl] and Tylenol [acetaminophen]   Review of Systems Review of Systems Per HPI  Physical Exam Triage Vital Signs ED Triage Vitals  Enc Vitals Group     BP 04/04/21 1239 (!) 144/66     Pulse Rate 04/04/21  1242 97     Resp 04/04/21 1239 17     Temp 04/04/21 1239 98.3 F (36.8 C)     Temp Source 04/04/21 1239 Oral     SpO2 04/04/21 1239 97 %     Weight --      Height --      Head Circumference --      Peak Flow --      Pain Score 04/04/21 1241 0     Pain Loc --      Pain Edu? --      Excl. in [place]? --    No data found.  Updated Vital Signs BP (!) 144/66 (BP Location: Right Arm)   Pulse 97   Temp 98.3 F (36.8 C) (Oral)   Resp 17   SpO2 90%   Visual Acuity Right Eye Distance:   Left Eye Distance:   Bilateral Distance:    Right Eye Near:   Left Eye Near:    Bilateral Near:     Physical Exam Vitals and nursing note reviewed.  Constitutional:      Appearance: Normal appearance. She is not ill-appearing.  HENT:     Head: Atraumatic.     Right Ear: Tympanic membrane normal.     Left Ear: Tympanic membrane normal.     Nose: Nose normal.     Mouth/Throat:     Mouth: Mucous membranes are moist.     Pharynx: Posterior oropharyngeal erythema present. No oropharyngeal exudate.  Eyes:     Extraocular Movements: Extraocular movements intact.     Conjunctiva/sclera: Conjunctivae normal.  Cardiovascular:     Rate and Rhythm: Normal rate and regular rhythm.     Heart sounds: Normal heart sounds.  Pulmonary:     Effort: Pulmonary effort is normal.     Breath sounds: Wheezing present. No rales.     Comments: Decreased breath sounds throughout Abdominal:     General: Bowel sounds are normal. There is no distension.     Palpations: Abdomen is soft.     Tenderness: There is no abdominal tenderness.  Musculoskeletal:        General: Normal range of motion.     Cervical back: Normal range of motion and neck supple.  Skin:    General: Skin is warm and dry.  Neurological:     Mental Status: She is alert and oriented to person, place, and time.  Psychiatric:        Mood and Affect: Mood normal.        Thought Content: Thought content normal.        Judgment: Judgment normal.    UC Treatments / Results  Labs (all labs ordered are listed, but only abnormal results  are displayed) Labs Reviewed - No data to display  EKG  Radiology No results found.  Procedures Procedures (including critical care time)  Medications Ordered in UC Medications - No data to display  Initial Impression / Assessment and Plan / UC Course  I have reviewed the triage vital signs and the nursing notes.  Pertinent labs & imaging results that were available during my care of the patient were reviewed by me and considered in my medical decision making (see chart for details).     Oxygen saturation and triage on room air was between 86% and 90%, placed on 2 L of O2 via nasal cannula and oxygen improved to 100% and her symptoms improved drastically.  We will treat for COPD exacerbation with azithromycin, prednisone, Mucinex.  She has Symbicort and albuterol at home that she is to continue using.  Discussed using oxygen continuously until feeling significantly better as she came in hypoxic on room air.  Also discussed going directly to the emergency department if her symptoms or not improving and worsening at any time.  Follow-up with PCP for recheck within the next week.  Final Clinical Impressions(s) / UC Diagnoses   Final diagnoses:  COPD exacerbation ([place])  Chronic respiratory failure with hypoxia ([facility])  Weakness  On home oxygen therapy   Discharge Instructions   None    ED Prescriptions     Medication Sig Dispense Auth. Provider   azithromycin (ZITHROMAX) 250 MG tablet Take first 2 tablets together, then 1 every day until finished. 6 tablet Volney American, [place]   predniSONE (DELTASONE) 20 MG tablet Take 2 tablets (40 mg total) by mouth daily with breakfast. 10 tablet Volney American, PA-C   guaiFENesin (MUCINEX) 600 MG 12 hr tablet Take 1 tablet (600 mg total) by mouth 2 (two) times daily. 30 tablet Volney American, [place]      PDMP not reviewed this  encounter.   Volney American, [place] 04/04/21 1345

## 2021-04-05 DIAGNOSIS — J449 Chronic obstructive pulmonary disease, unspecified: Secondary | ICD-10-CM | POA: Diagnosis not present

## 2021-05-06 DIAGNOSIS — J449 Chronic obstructive pulmonary disease, unspecified: Secondary | ICD-10-CM | POA: Diagnosis not present

## 2022-08-26 ENCOUNTER — Emergency Department (HOSPITAL_COMMUNITY)
Admission: EM | Admit: 2022-08-26 | Discharge: 2022-08-27 | Discharge status: Against Medical Advice | Payer: Self-pay | Attending: Emergency Medicine | Admitting: Emergency Medicine

## 2022-08-26 ENCOUNTER — Encounter (HOSPITAL_COMMUNITY): Payer: Self-pay | Admitting: Emergency Medicine

## 2022-08-26 ENCOUNTER — Emergency Department (HOSPITAL_COMMUNITY): Payer: Self-pay

## 2022-08-26 DIAGNOSIS — Z5321 Procedure and treatment not carried out due to patient leaving prior to being seen by health care provider: Secondary | ICD-10-CM | POA: Insufficient documentation

## 2022-08-26 DIAGNOSIS — R531 Weakness: Secondary | ICD-10-CM | POA: Insufficient documentation

## 2022-08-26 DIAGNOSIS — R0602 Shortness of breath: Secondary | ICD-10-CM | POA: Insufficient documentation

## 2022-08-26 LAB — BASIC METABOLIC PANEL
Anion gap: 8 (ref 5–15)
BUN: 5 mg/dL — ABNORMAL LOW (ref 8–23)
CO2: 35 mmol/L — ABNORMAL HIGH (ref 22–32)
Calcium: 9.5 mg/dL (ref 8.9–10.3)
Chloride: 95 mmol/L — ABNORMAL LOW (ref 98–111)
Creatinine, Ser: 0.77 mg/dL (ref 0.44–1.00)
GFR, Estimated: >60 mL/min (ref >60)
Glucose, Bld: 125 mg/dL — ABNORMAL HIGH (ref 70–99)
Potassium: 3.4 mmol/L — ABNORMAL LOW (ref 3.5–5.1)
Sodium: 138 mmol/L (ref 135–145)

## 2022-08-26 LAB — CBC WITH DIFFERENTIAL/PLATELET
Abs Immature Granulocytes: 0.02 10*3/uL (ref 0.00–0.07)
Basophils Absolute: 0 10*3/uL (ref 0.0–0.1)
Basophils Relative: 1 %
Eosinophils Absolute: 0.1 10*3/uL (ref 0.0–0.5)
Eosinophils Relative: 1 %
HCT: 38 % (ref 36.0–46.0)
Hemoglobin: 12.6 g/dL (ref 12.0–15.0)
Immature Granulocytes: 0 %
Lymphocytes Relative: 11 %
Lymphs Abs: 0.5 10*3/uL — ABNORMAL LOW (ref 0.7–4.0)
MCH: 30.8 pg (ref 26.0–34.0)
MCHC: 33.2 g/dL (ref 30.0–36.0)
MCV: 92.9 fL (ref 80.0–100.0)
Monocytes Absolute: 0.7 10*3/uL (ref 0.1–1.0)
Monocytes Relative: 14 %
Neutro Abs: 3.5 10*3/uL (ref 1.7–7.7)
Neutrophils Relative %: 73 %
Platelets: 222 10*3/uL (ref 150–400)
RBC: 4.09 MIL/uL (ref 3.87–5.11)
RDW: 14.6 % (ref 11.5–15.5)
WBC: 4.8 10*3/uL (ref 4.0–10.5)
nRBC: 0 % (ref 0.0–0.2)

## 2022-08-26 LAB — BRAIN NATRIURETIC PEPTIDE: B Natriuretic Peptide: 139 pg/mL — ABNORMAL HIGH (ref 0.0–100.0)

## 2022-08-26 NOTE — ED Triage Notes (Signed)
Pt 83% on RA, states she sometimes wears O2 at home but unsure how much. Pt states she feels unwell for awhile, including weakness and anxiety.  Son came up to triage and reports she refuses to wear O2 at home, not eating or taking care of herself and this has been going on for about a year.

## 2022-08-26 NOTE — ED Provider Triage Note (Signed)
Emergency Medicine Provider Triage Evaluation Note  Victoria Kennedy , a 76 y.o. female  was evaluated in triage.  Pt complains of vague complaints of feeling weak/nervous.  Patient denies chest pain, abdominal pain, shortness of breath.  Notably patient's oxygen saturation on room air was in the 70s upon arrival.  She states she does wear oxygen at home only when absolutely necessary but endorses having to use up to 6 L/min at home to maintain saturations at times.  She denies any falls, fevers, cough.  Review of Systems  Positive: As above Negative: As above  Physical Exam  BP (!) 163/96   Pulse 88   Resp 20   SpO2 94%  Gen:   Awake, no distress   Resp:  Normal effort  MSK:   Moves extremities without difficulty  Other:    Medical Decision Making  Medically screening exam initiated at 6:46 PM.  Appropriate orders placed.  Victoria Kennedy was informed that the remainder of the evaluation will be completed by another provider, this initial triage assessment does not replace that evaluation, and the importance of remaining in the ED until their evaluation is complete.     Dorothyann Peng, [place] 08/26/22 1848

## 2022-08-27 NOTE — ED Notes (Addendum)
Called pts name 3x to be roomed with no response. Pt not seen in lobby at this time.

## 2022-11-24 ENCOUNTER — Encounter (HOSPITAL_COMMUNITY): Payer: Self-pay

## 2022-11-24 ENCOUNTER — Emergency Department (HOSPITAL_COMMUNITY): Payer: Self-pay

## 2022-11-24 ENCOUNTER — Emergency Department (HOSPITAL_COMMUNITY)
Admission: EM | Admit: 2022-11-24 | Discharge: 2022-11-24 | Disposition: A | Discharge status: Home / Self Care | Payer: Self-pay | Attending: Emergency Medicine | Admitting: Emergency Medicine

## 2022-11-24 DIAGNOSIS — F0390 Unspecified dementia without behavioral disturbance: Secondary | ICD-10-CM | POA: Insufficient documentation

## 2022-11-24 DIAGNOSIS — J111 Influenza due to unidentified influenza virus with other respiratory manifestations: Secondary | ICD-10-CM

## 2022-11-24 DIAGNOSIS — Z87891 Personal history of nicotine dependence: Secondary | ICD-10-CM | POA: Insufficient documentation

## 2022-11-24 DIAGNOSIS — J449 Chronic obstructive pulmonary disease, unspecified: Secondary | ICD-10-CM | POA: Insufficient documentation

## 2022-11-24 DIAGNOSIS — Z7982 Long term (current) use of aspirin: Secondary | ICD-10-CM | POA: Insufficient documentation

## 2022-11-24 DIAGNOSIS — Z1152 Encounter for screening for COVID-19: Secondary | ICD-10-CM | POA: Insufficient documentation

## 2022-11-24 DIAGNOSIS — J101 Influenza due to other identified influenza virus with other respiratory manifestations: Secondary | ICD-10-CM | POA: Insufficient documentation

## 2022-11-24 LAB — CBC WITH DIFFERENTIAL/PLATELET
Abs Immature Granulocytes: 0 10*3/uL (ref 0.00–0.07)
Basophils Absolute: 0 10*3/uL (ref 0.0–0.1)
Basophils Relative: 0 %
Eosinophils Absolute: 0 10*3/uL (ref 0.0–0.5)
Eosinophils Relative: 0 %
HCT: 44.2 % (ref 36.0–46.0)
Hemoglobin: 14.6 g/dL (ref 12.0–15.0)
Lymphocytes Relative: 8 %
Lymphs Abs: 0.4 10*3/uL — ABNORMAL LOW (ref 0.7–4.0)
MCH: 30.7 pg (ref 26.0–34.0)
MCHC: 33 g/dL (ref 30.0–36.0)
MCV: 93.1 fL (ref 80.0–100.0)
Monocytes Absolute: 0.7 10*3/uL (ref 0.1–1.0)
Monocytes Relative: 13 %
Neutro Abs: 4.3 10*3/uL (ref 1.7–7.7)
Neutrophils Relative %: 79 %
Platelets: 149 10*3/uL — ABNORMAL LOW (ref 150–400)
RBC: 4.75 MIL/uL (ref 3.87–5.11)
RDW: 13 % (ref 11.5–15.5)
WBC: 5.5 10*3/uL (ref 4.0–10.5)
nRBC: 0 % (ref 0.0–0.2)
nRBC: 0 /100 WBC

## 2022-11-24 LAB — COMPREHENSIVE METABOLIC PANEL
ALT: 16 U/L (ref 0–44)
AST: 45 U/L — ABNORMAL HIGH (ref 15–41)
Albumin: 2.7 g/dL — ABNORMAL LOW (ref 3.5–5.0)
Alkaline Phosphatase: 57 U/L (ref 38–126)
Anion gap: 11 (ref 5–15)
BUN: 23 mg/dL (ref 8–23)
CO2: 30 mmol/L (ref 22–32)
Calcium: 9 mg/dL (ref 8.9–10.3)
Chloride: 96 mmol/L — ABNORMAL LOW (ref 98–111)
Creatinine, Ser: 0.97 mg/dL (ref 0.44–1.00)
GFR, Estimated: >60 mL/min (ref >60)
Glucose, Bld: 134 mg/dL — ABNORMAL HIGH (ref 70–99)
Potassium: 3.7 mmol/L (ref 3.5–5.1)
Sodium: 137 mmol/L (ref 135–145)
Total Bilirubin: 1 mg/dL (ref 0.3–1.2)
Total Protein: 6.8 g/dL (ref 6.5–8.1)

## 2022-11-24 LAB — LACTIC ACID, PLASMA: Lactic Acid, Venous: 2.1 mmol/L (ref 0.5–1.9)

## 2022-11-24 LAB — RESP PANEL BY RT-PCR (RSV, FLU A&B, COVID)  RVPGX2
Influenza A by PCR: NEGATIVE
Influenza B by PCR: POSITIVE — AB
Resp Syncytial Virus by PCR: NEGATIVE
SARS Coronavirus 2 by RT PCR: NEGATIVE

## 2022-11-24 LAB — MAGNESIUM: Magnesium: 2 mg/dL (ref 1.7–2.4)

## 2022-11-24 LAB — ETHANOL: Alcohol, Ethyl (B): <10 mg/dL (ref <10)

## 2022-11-24 MED ORDER — OSELTAMIVIR PHOSPHATE 75 MG PO CAPS: 75.0000 mg | ORAL_CAPSULE | Freq: Two times a day (BID) | ORAL | 0 refills | Status: AC

## 2022-11-24 MED ORDER — SODIUM CHLORIDE 0.9 % IV BOLUS
500.0000 mL | Freq: Once | INTRAVENOUS | Status: AC
  Administered 2022-11-24: 500 mL via INTRAVENOUS

## 2022-11-24 MED ORDER — PREDNISONE 20 MG PO TABS
60.0000 mg | ORAL_TABLET | ORAL | Status: AC
  Administered 2022-11-24: 60 mg via ORAL
  Filled 2022-11-24: qty 3

## 2022-11-24 MED ORDER — PREDNISONE 20 MG PO TABS: 40.0000 mg | ORAL_TABLET | Freq: Every day | ORAL | 0 refills | Status: AC

## 2022-11-24 MED ORDER — AZITHROMYCIN 250 MG PO TABS: 250.0000 mg | ORAL_TABLET | Freq: Every day | ORAL | 0 refills | Status: AC

## 2022-11-24 MED ORDER — AZITHROMYCIN 250 MG PO TABS
500.0000 mg | ORAL_TABLET | Freq: Once | ORAL | Status: AC
  Administered 2022-11-24: 500 mg via ORAL
  Filled 2022-11-24: qty 2

## 2022-11-24 MED ORDER — OSELTAMIVIR PHOSPHATE 75 MG PO CAPS
75.0000 mg | ORAL_CAPSULE | Freq: Once | ORAL | Status: AC
  Administered 2022-11-24: 75 mg via ORAL
  Filled 2022-11-24: qty 1

## 2022-11-24 NOTE — Discharge Instructions (Signed)
You have been diagnosed with influenza.  Given your history of COPD, and consideration of concurrent exacerbation you are also starting medications appropriate for this disease process please take all medication as prescribed.  Be sure to speak with your physician tomorrow to arrange appropriate ongoing outpatient follow-up and ensure that you have access to home oxygen which you should continue using as needed.  In addition, please continue using your inhaler.  Return here for concerning changes in your condition.

## 2022-11-24 NOTE — ED Triage Notes (Addendum)
Pt arrives via POV. Pt reports worsening fatigue, abdominal pain, and decreased appetite for the past week. Pt is lethargic and is confused about the current date.

## 2022-11-24 NOTE — ED Provider Notes (Signed)
[facility] Provider Note   CSN: [id number] Arrival date & time: 11/24/22  1230     History  Chief Complaint  Patient presents with   Abdominal Pain   Weakness    Victoria Kennedy is a 77 y.o. female.  HPI Patient presents with her sister who assists with history.  Patient has dementia, level 5 caveat. According to patient's sister patient has had progression over the past 2 years since her initial diagnosis, but over the past week has become less interactive, less verbal.  The patient responds to questions about discomfort stating she feels as though she was shot, cannot specify focal pain, focal weakness, discomfort, however.  Sister notes there are multiple sick family members in the house the patient resides in.     Home Medications Prior to Admission medications   Medication Sig Start Date End Date Taking? Authorizing Provider  azithromycin (ZITHROMAX) 250 MG tablet Take 1 tablet (250 mg total) by mouth daily for 4 days. Take 1 every day until finished. 11/24/22 11/28/22 Yes Carmin Muskrat, MD  oseltamivir (TAMIFLU) 75 MG capsule Take 1 capsule (75 mg total) by mouth every 12 (twelve) hours. 11/24/22  Yes Carmin Muskrat, MD  predniSONE (DELTASONE) 20 MG tablet Take 2 tablets (40 mg total) by mouth daily with breakfast. For the next four days 11/24/22  Yes Carmin Muskrat, MD  albuterol [facility] HFA) 108 (90 BASE) MCG/ACT inhaler Inhale 2 puffs into the lungs every 4 (four) hours as needed for wheezing or shortness of breath.    [provider]  aspirin EC 81 MG tablet Take 81 mg by mouth daily.    [provider]  atorvastatin (LIPITOR) 20 MG tablet Take 20 mg by mouth daily at 6 PM. 11/06/12   Charolette Forward, MD  azithromycin (ZITHROMAX) 250 MG tablet Take first 2 tablets together, then 1 every day until finished. 04/04/21   Volney American, PA-C  budesonide-formoterol [facility]) 160-4.5 MCG/ACT inhaler Take 2  puffs first thing in am and then another 2 puffs about 12 hours later. 03/02/14   Tanda Rockers, MD  Cholecalciferol (VITAMIN D) 2000 UNITS tablet Take 2,000 Units by mouth daily.    [provider]  docusate sodium (COLACE) 100 MG capsule Take 100 mg by mouth 2 (two) times daily.     [provider]  guaiFENesin (MUCINEX) 600 MG 12 hr tablet Take 1 tablet (600 mg total) by mouth 2 (two) times daily. 04/04/21   Volney American, PA-C  nitroGLYCERIN (NITROSTAT) 0.4 MG SL tablet Place 0.4 mg under the tongue every 5 (five) minutes x 3 doses as needed for chest pain. 11/06/12   Charolette Forward, MD  polyethylene glycol (MIRALAX / GLYCOLAX) packet Take 17 g by mouth daily as needed.     [provider]  predniSONE (DELTASONE) 20 MG tablet Take 2 tablets (40 mg total) by mouth daily with breakfast. 04/04/21   Volney American, PA-C  valACYclovir (VALTREX) 500 MG tablet Take 1 tablet (500 mg total) by mouth 2 (two) times daily. 03/17/15   Hoyt Koch, MD      Allergies    Benadryl [diphenhydramine hcl] and Tylenol [acetaminophen]    Review of Systems   Review of Systems  Unable to perform ROS: Dementia    Physical Exam Updated Vital Signs BP (!) 153/67   Pulse (!) 108   Temp 98.9 F (37.2 C)   Resp 14   SpO2 91%  Physical Exam Vitals and nursing note reviewed.  Constitutional:      General: She is not in acute distress.    Appearance: She is ill-appearing. She is not toxic-appearing or diaphoretic.  HENT:     Head: Normocephalic and atraumatic.  Eyes:     Conjunctiva/sclera: Conjunctivae normal.  Cardiovascular:     Rate and Rhythm: Regular rhythm. Tachycardia present.  Pulmonary:     Effort: Pulmonary effort is normal. No respiratory distress.     Breath sounds: Normal breath sounds. No stridor.  Abdominal:     General: There is no distension.  Skin:    General: Skin is warm and dry.  Neurological:     Mental Status: She is alert.      Cranial Nerves: No cranial nerve deficit.     Motor: Atrophy present. No tremor or abnormal muscle tone.  Psychiatric:        Behavior: Behavior is slowed and withdrawn.        Cognition and Memory: Cognition is impaired. Memory is impaired.     ED Results / Procedures / Treatments   Labs (all labs ordered are listed, but only abnormal results are displayed) Labs Reviewed  RESP PANEL BY RT-PCR (RSV, FLU A&B, COVID)  RVPGX2 - Abnormal; Notable for the following components:      Result Value   Influenza B by PCR POSITIVE (*)    All other components within normal limits  COMPREHENSIVE METABOLIC PANEL - Abnormal; Notable for the following components:   Chloride 96 (*)    Glucose, Bld 134 (*)    Albumin 2.7 (*)    AST 45 (*)    All other components within normal limits  LACTIC ACID, PLASMA - Abnormal; Notable for the following components:   Lactic Acid, Venous 2.1 (*)    All other components within normal limits  CBC WITH DIFFERENTIAL/PLATELET - Abnormal; Notable for the following components:   Platelets 149 (*)    Lymphs Abs 0.4 (*)    All other components within normal limits  ETHANOL  MAGNESIUM  LACTIC ACID, PLASMA    EKG EKG Interpretation  Date/Time:  Sunday November 24 2022 12:54:12 EST Ventricular Rate:  103 PR Interval:  115 QRS Duration: 80 QT Interval:  332 QTC Calculation: 435 R Axis:   81 Text Interpretation: Sinus tachycardia Ventricular premature complex Aberrant complex Artifact Baseline wander Abnormal ECG Confirmed by Carmin Muskrat [phone]) on 11/24/2022 1:00:54 PM  Radiology DG Chest 2 View  Result Date: 11/24/2022 CLINICAL DATA:  weak EXAM: CHEST - 2 VIEW COMPARISON:  August 26, 2022 FINDINGS: The cardiomediastinal silhouette is unchanged in contour.Atherosclerotic calcifications. Small bilateral pleural effusions. No pneumothorax. New patchy heterogeneous opacities of the RIGHT greater than LEFT lower lungs. Visualized abdomen is unremarkable. Osteopenia.  IMPRESSION: New patchy heterogeneous opacities of the RIGHT greater than LEFT lower lungs with small bilateral pleural effusions. Differential considerations include multifocal infection or aspiration. Followup PA and lateral chest X-ray is recommended in 4 weeks following appropriate treatment to ensure resolution. Electronically Signed   By: Valentino Saxon M.D.   On: 11/24/2022 14:00    Procedures Procedures    Medications Ordered in ED Medications  azithromycin (ZITHROMAX) tablet 500 mg (has no administration in time range)  predniSONE (DELTASONE) tablet 60 mg (has no administration in time range)  oseltamivir (TAMIFLU) capsule 75 mg (75 mg Oral Given 11/24/22 1539)    ED Course/ Medical Decision Making/ A&P  Medical Decision Making Patient with dementia, COPD presents with weakness, fatigue, anorexia, withdrawn status.  Patient is here with her sister who provides history. Additional details obtained on chart review, including COPD management.  Patient had differential including pneumonia, bacteremia, sepsis, electrolyte abnormalities, progression of disease.  With a soft, nonperitoneal abdomen, no vomiting, lower suspicion for acute intra-abdominal processes. Patient placed on continuous cardiac monitoring, pulse oximetry. Cardiac 105 sinus tach abnormal Pulse ox 91% abnormal, though possibly normal for patient with advanced COPD.   Amount and/or Complexity of Data Reviewed Independent Historian:     Details: Sister HPI External Data Reviewed: notes. Labs: ordered. Decision-making details documented in ED Course. Radiology: ordered and independent interpretation performed. Decision-making details documented in ED Course. ECG/medicine tests: ordered and independent interpretation performed. Decision-making details documented in ED Course.  Risk Prescription drug management. Decision regarding hospitalization.   3:48 PM Patient in no distress  sitting upright.  I discussed influenza positive result, abnormal chest x-ray with the patient's sister.  We discussed the patient's chart history of needing oxygen intermittently at home, given history of COPD, prior smoking.  Patient is not currently using oxygen at home.  Patient is in no distress, family aware of all findings.  No evidence for bacteremia, sepsis, labs generally better than multiple prior studies, x-ray consistent with pneumonia, influenza. Patient will start Tamiflu and given her history of COPD, concern for concurrent infection also antibiotics, as well as steroids.  Absent distress, increased work of breathing with well-documented chart history of chronic respiratory difficulty, patient appropriate for return home, close outpatient follow-up.        Final Clinical Impression(s) / ED Diagnoses Final diagnoses:  Influenza    Rx / DC Orders ED Discharge Orders          Ordered    oseltamivir (TAMIFLU) 75 MG capsule  Every 12 hours        11/24/22 1548    azithromycin (ZITHROMAX) 250 MG tablet  Daily        11/24/22 1548    predniSONE (DELTASONE) 20 MG tablet  Daily with breakfast        11/24/22 1548              Carmin Muskrat, MD 11/24/22 [phone]

## 2024-05-24 DEATH — deceased
